# Patient Record
Sex: Male | Born: 1960 | Race: Black or African American | Hispanic: No | Smoking: Never smoker
Health system: Southern US, Community
[De-identification: ages and names within clinical notes are randomized; demographics above are authoritative.]

## PROBLEM LIST (undated history)

## (undated) DIAGNOSIS — R269 Unspecified abnormalities of gait and mobility: Secondary | ICD-10-CM

## (undated) DIAGNOSIS — E559 Vitamin D deficiency, unspecified: Secondary | ICD-10-CM

## (undated) DIAGNOSIS — N319 Neuromuscular dysfunction of bladder, unspecified: Secondary | ICD-10-CM

## (undated) DIAGNOSIS — G35 Multiple sclerosis: Secondary | ICD-10-CM

## (undated) DIAGNOSIS — F09 Unspecified mental disorder due to known physiological condition: Secondary | ICD-10-CM

## (undated) DIAGNOSIS — F079 Unspecified personality and behavioral disorder due to known physiological condition: Secondary | ICD-10-CM

## (undated) DIAGNOSIS — G8250 Quadriplegia, unspecified: Secondary | ICD-10-CM

## (undated) DIAGNOSIS — F0390 Unspecified dementia without behavioral disturbance: Secondary | ICD-10-CM

## (undated) HISTORY — DX: Multiple sclerosis: G35

## (undated) HISTORY — PX: OTHER SURGICAL HISTORY: SHX169

## (undated) HISTORY — DX: Unspecified personality and behavioral disorder due to known physiological condition: F07.9

## (undated) HISTORY — DX: Neuromuscular dysfunction of bladder, unspecified: N31.9

## (undated) HISTORY — DX: Unspecified dementia, unspecified severity, without behavioral disturbance, psychotic disturbance, mood disturbance, and anxiety: F03.90

## (undated) HISTORY — DX: Vitamin D deficiency, unspecified: E55.9

## (undated) HISTORY — DX: Unspecified mental disorder due to known physiological condition: F09

## (undated) HISTORY — DX: Quadriplegia, unspecified: G82.50

## (undated) HISTORY — DX: Unspecified abnormalities of gait and mobility: R26.9

---

## 1998-04-01 ENCOUNTER — Emergency Department (HOSPITAL_COMMUNITY): Admission: EM | Admit: 1998-04-01 | Discharge: 1998-04-01 | Payer: Self-pay | Admitting: Internal Medicine

## 1999-01-13 ENCOUNTER — Ambulatory Visit (HOSPITAL_COMMUNITY): Admission: RE | Admit: 1999-01-13 | Discharge: 1999-01-13 | Payer: Self-pay | Admitting: Neurology

## 1999-04-27 ENCOUNTER — Encounter: Payer: Self-pay | Admitting: Emergency Medicine

## 1999-04-27 ENCOUNTER — Inpatient Hospital Stay (HOSPITAL_COMMUNITY): Admission: EM | Admit: 1999-04-27 | Discharge: 1999-05-04 | Payer: Self-pay | Admitting: Emergency Medicine

## 1999-04-28 ENCOUNTER — Encounter: Payer: Self-pay | Admitting: Internal Medicine

## 1999-05-04 ENCOUNTER — Inpatient Hospital Stay (HOSPITAL_COMMUNITY)
Admission: RE | Admit: 1999-05-04 | Discharge: 1999-05-21 | Payer: Self-pay | Admitting: Physical Medicine and Rehabilitation

## 2000-09-05 ENCOUNTER — Encounter: Admission: RE | Admit: 2000-09-05 | Discharge: 2000-11-01 | Payer: Self-pay | Admitting: Neurology

## 2002-04-03 ENCOUNTER — Encounter: Payer: Self-pay | Admitting: Emergency Medicine

## 2002-04-04 ENCOUNTER — Encounter: Payer: Self-pay | Admitting: Internal Medicine

## 2002-04-04 ENCOUNTER — Inpatient Hospital Stay (HOSPITAL_COMMUNITY): Admission: EM | Admit: 2002-04-04 | Discharge: 2002-04-08 | Payer: Self-pay

## 2005-03-07 ENCOUNTER — Encounter: Admission: RE | Admit: 2005-03-07 | Discharge: 2005-04-07 | Payer: Self-pay | Admitting: Neurology

## 2005-05-27 ENCOUNTER — Inpatient Hospital Stay (HOSPITAL_COMMUNITY): Admission: AD | Admit: 2005-05-27 | Discharge: 2005-06-01 | Payer: Self-pay | Admitting: Neurology

## 2007-10-02 ENCOUNTER — Emergency Department (HOSPITAL_COMMUNITY): Admission: EM | Admit: 2007-10-02 | Discharge: 2007-10-02 | Payer: Self-pay | Admitting: Emergency Medicine

## 2008-06-19 ENCOUNTER — Encounter: Admission: RE | Admit: 2008-06-19 | Discharge: 2008-07-22 | Payer: Self-pay | Admitting: Neurology

## 2008-07-24 ENCOUNTER — Encounter: Admission: RE | Admit: 2008-07-24 | Discharge: 2008-10-22 | Payer: Self-pay | Admitting: Neurology

## 2009-03-23 ENCOUNTER — Emergency Department (HOSPITAL_COMMUNITY): Admission: EM | Admit: 2009-03-23 | Discharge: 2009-03-23 | Payer: Self-pay | Admitting: Emergency Medicine

## 2009-10-11 ENCOUNTER — Inpatient Hospital Stay (HOSPITAL_COMMUNITY): Admission: EM | Admit: 2009-10-11 | Discharge: 2009-10-16 | Payer: Self-pay | Admitting: Emergency Medicine

## 2010-06-19 LAB — CBC
Hemoglobin: 11.4 g/dL — ABNORMAL LOW (ref 13.0–17.0)
MCH: 28.9 pg (ref 26.0–34.0)
MCV: 85.1 fL (ref 78.0–100.0)
RDW: 14.5 % (ref 11.5–15.5)
WBC: 10.5 10*3/uL (ref 4.0–10.5)

## 2010-06-20 LAB — CBC
HCT: 37.6 % — ABNORMAL LOW (ref 39.0–52.0)
HCT: 40.3 % (ref 39.0–52.0)
Hemoglobin: 12.4 g/dL — ABNORMAL LOW (ref 13.0–17.0)
Hemoglobin: 12.6 g/dL — ABNORMAL LOW (ref 13.0–17.0)
MCH: 28.6 pg (ref 26.0–34.0)
MCHC: 33.5 g/dL (ref 30.0–36.0)
MCHC: 34 g/dL (ref 30.0–36.0)
MCV: 84.3 fL (ref 78.0–100.0)
MCV: 86.1 fL (ref 78.0–100.0)
Platelets: 191 10*3/uL (ref 150–400)
RBC: 4.37 MIL/uL (ref 4.22–5.81)
RDW: 14.3 % (ref 11.5–15.5)
RDW: 15.1 % (ref 11.5–15.5)
WBC: 12.9 10*3/uL — ABNORMAL HIGH (ref 4.0–10.5)

## 2010-06-20 LAB — BASIC METABOLIC PANEL
BUN: 12 mg/dL (ref 6–23)
CO2: 26 mEq/L (ref 19–32)
Calcium: 7.6 mg/dL — ABNORMAL LOW (ref 8.4–10.5)
Calcium: 7.6 mg/dL — ABNORMAL LOW (ref 8.4–10.5)
Calcium: 7.9 mg/dL — ABNORMAL LOW (ref 8.4–10.5)
Calcium: 8.2 mg/dL — ABNORMAL LOW (ref 8.4–10.5)
Chloride: 102 mEq/L (ref 96–112)
Chloride: 103 mEq/L (ref 96–112)
Chloride: 105 mEq/L (ref 96–112)
Chloride: 106 mEq/L (ref 96–112)
GFR calc Af Amer: >60 mL/min (ref >60)
GFR calc Af Amer: >60 mL/min (ref >60)
GFR calc Af Amer: >60 mL/min (ref >60)
GFR calc non Af Amer: 53 mL/min — ABNORMAL LOW (ref >60)
GFR calc non Af Amer: >60 mL/min (ref >60)
Glucose, Bld: 122 mg/dL — ABNORMAL HIGH (ref 70–99)
Glucose, Bld: 139 mg/dL — ABNORMAL HIGH (ref 70–99)
Glucose, Bld: 92 mg/dL (ref 70–99)
Potassium: 3.1 mEq/L — ABNORMAL LOW (ref 3.5–5.1)
Potassium: 3.9 mEq/L (ref 3.5–5.1)
Sodium: 136 mEq/L (ref 135–145)
Sodium: 137 mEq/L (ref 135–145)
Sodium: 139 mEq/L (ref 135–145)

## 2010-06-20 LAB — CULTURE, BLOOD (ROUTINE X 2): Culture: NO GROWTH

## 2010-06-20 LAB — POCT CARDIAC MARKERS
Myoglobin, poc: 428 ng/mL (ref 12–200)
Troponin i, poc: <0.05 ng/mL (ref 0.00–0.09)

## 2010-06-20 LAB — DIFFERENTIAL
Basophils Absolute: 0 10*3/uL (ref 0.0–0.1)
Basophils Relative: 0 % (ref 0–1)
Eosinophils Absolute: 0 10*3/uL (ref 0.0–0.7)
Eosinophils Absolute: 0 10*3/uL (ref 0.0–0.7)
Eosinophils Relative: 0 % (ref 0–5)
Eosinophils Relative: 0 % (ref 0–5)
Lymphs Abs: 0.9 10*3/uL (ref 0.7–4.0)
Monocytes Absolute: 0.3 10*3/uL (ref 0.1–1.0)
Monocytes Absolute: 0.9 10*3/uL (ref 0.1–1.0)
Monocytes Relative: 7 % (ref 3–12)
Neutro Abs: 8.8 10*3/uL — ABNORMAL HIGH (ref 1.7–7.7)
Neutrophils Relative %: 85 % — ABNORMAL HIGH (ref 43–77)
Neutrophils Relative %: 89 % — ABNORMAL HIGH (ref 43–77)

## 2010-06-20 LAB — URINALYSIS, ROUTINE W REFLEX MICROSCOPIC
Glucose, UA: NEGATIVE mg/dL
Nitrite: NEGATIVE
Specific Gravity, Urine: 1.034 — ABNORMAL HIGH (ref 1.005–1.030)

## 2010-06-20 LAB — URINE MICROSCOPIC-ADD ON

## 2010-06-20 LAB — URINE CULTURE: Colony Count: >=100000

## 2010-08-20 NOTE — H&P (Signed)
Shawn, Woodard NO.:  [id number]   MEDICAL RECORD NO.:  [id number]          PATIENT TYPE:  INP   LOCATION:  3033                         FACILITY:  [facility]   PHYSICIAN:  Marlan Palau, M.D.  DATE OF BIRTH:  1960/09/07   DATE OF ADMISSION:  05/27/2005  DATE OF DISCHARGE:                                HISTORY & PHYSICAL   HISTORY OF PRESENT ILLNESS:  Shawn Woodard is a 50 year old black male, born  07/28/1960, with a history of multiple sclerosis.  This patient has had a  gradual progressive downhill course of MS over the last 15 to 18 years,  followed through [facility].  The patient has been  treated with Avonex but has continued to decline in his physical abilities.  This patient has been residing in an assisted living situation but it has  gotten to the point that he now requires skilled nursing care.  The patient  is transitioning to the hospital for three days prior to admission to a  skilled level nursing facility.  The has gradually had worsening cognitive  abilities and right hemiparesis and is no longer able to ambulate.   PAST MEDICAL HISTORY:  1.  History of multiple sclerosis.  2.  Gait disorder.  3.  Organic brain syndrome.  4.  Neurogenic bladder.   MEDICATIONS:  1.  Avonex once a week injection.  2.  Baclofen 10 mg q.i.d.   The patient has no known allergies.   He does not smoke or drink.   SOCIAL HISTORY:  This patient is single and has no children.  He has had to  retire as a Press photographer, has a Naval architect.   FAMILY MEDICAL HISTORY:  Notable that the parents are alive.  He has a  brother, two sisters.   REVIEW OF SYSTEMS:  Notable for no recent fevers, chills.  The patient  denies headache, visual field changes.  Denies any shortness of breath,  chest pain, abdominal pain, nausea, vomiting.  Denies any problems  controlling the bowels or bladder.  Denies any new numbness.  He has had  progressive weakness on the right side.  The patient denies any dizziness.   PHYSICAL EXAMINATION:  VITAL SIGNS:  Blood pressure is 120/80, heart rate  64.  GENERAL:  This patient is a thin black male who is alert, cooperative at the  time of examination.  HEENT:  Head is atraumatic.  Eyes:  Pupils are round, reactive to light.  Disks soft, flat bilaterally.  NECK:  Supple.  No carotid bruits noted.  RESPIRATORY:  Clear.  CARDIOVASCULAR:  Reveals a regular rate and rhythm.  No obvious murmurs rubs  noted.  ABDOMEN:  Reveals positive bowel sounds.  No organomegaly or tenderness  noted.  EXTREMITIES:  Without significant edema.  NEUROLOGIC:  Cranial nerves as above.  Facial symmetry is present.  The  patient has full extraocular movements with the exception of a prominent  left internuclear ophthalmoplegia, some end gaze nystagmus is seen.  The  patient has full visual fields to double simultaneous stimulation.  Speech  has a cerebellar quality.  Dysarthria is noted.  Good pinprick, soft touch  sensation to the face is noted.  Good strength of the facial muscles,  muscles for head turning, shoulder shrug bilaterally.  No aphasia noted.  Motor testing reveals obvious right sided weakness, decreased grip on the  right side, increased motor tone on the right arm, right leg as compared to  the left.  The patient has good strength on the left arm and leg, 4/5  strength on the right.  The patient has good symmetry to pinprick, soft  touch, vibratory sensation throughout, has ataxia with finger-to-nose-  finger, toe-to-finger on the left.  Slowness and some ataxia noted on the  right.  Decreased rapid alternating movements to the right arm as compared  to the left.  The patient has drift on the right arm.  Deep tendon reflexes  are if anything slightly decreased on the right arm as compared to the left.  The patient has more symmetry at the reflexes in the legs.  He has a very  definite  upgoing toe on the right.  Some ankle clonus noted on the left.  The patient has good pinprick, soft touch, vibratory sensation throughout.  The patient was not ambulated.   LABORATORY VALUES:  Notable for a white count of 5.5, hemoglobin 15.0,  hematocrit 45.5, MCV of 86.1, platelets of 283.  Sodium 139, potassium 4.3,  chloride of 104, CO2 of 31, glucose of 117, BUN of 10, creatinine 0.9.  Total bili 1.2, alk phos is 74, SGOT of 31, SGPT of 18, total protein 6.1,  albumin of 3.5, calcium 8.4.  Chest x-ray is pending.  EKG is pending.  Urinalysis also pending.   IMPRESSION:  1.  History of multiple sclerosis with progressive course.  2.  Organic brain syndrome secondary to number one.  3.  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   The patient has had significant worsening in physical and cognitive  disability.  He is no longer able to ambulate.  The patient will require  skilled nursing facility at this time.  The patient is brought in for  evaluation prior to transfer to a skilled facility.   PLAN:  1.  Admission blood work.  2.  Avonex treatments.  3.  Case manager for help with transfer to a skilled nursing facility.  4.  The patient be seen as well by physical and occupational therapy.  5.  We will follow the patient's clinical condition while in-house.      Marlan Palau, M.D.  Electronically Signed     CKW/MEDQ  D:  05/27/2005  T:  05/27/2005  Job:  [id number]   cc:   [facility]  [address]  [address]

## 2010-08-20 NOTE — Discharge Summary (Signed)
NAMEQUANTE, PETTRY NO.:  [id number]   MEDICAL RECORD NO.:  [id number]          PATIENT TYPE:  INP   LOCATION:  3033                         FACILITY:  [facility]   PHYSICIAN:  Marlan Palau, M.D.  DATE OF BIRTH:  December 19, 1960   DATE OF ADMISSION:  05/27/2005  DATE OF DISCHARGE:                                 DISCHARGE SUMMARY   ADDENDUM  This is an addendum to a priority discharge summary done previously on  May 30, 2005.   HISTORY OF PRESENT ILLNESS:  Shawn Woodard is a 50 year old black male born  October 13, 1960, with a history of multiple sclerosis. The patient has  significant physical and cognitive deficits due to his multiple sclerosis.  The patient was admitted to [facility] on May 27, 2005, for 3-  day evaluation prior to transfer to an extended care facility. The patient  since last discharge summary dictation has been medically and  neurologically stable. The patient continues to have dementia, dysarthria,  cerebellar speech pattern, right hemiparesis, severe ataxia on all fours. At  this point, plans are for this patient to be discharged to an extended care  facility.   DISCHARGE MEDICATIONS:  1.  Avonex IM injections once a week on Mondays.  2.  Baclofen 10 mg p.o. q.i.d.  3.  Theragran M one tablet daily.  4.  Tylenol 650 mg p.o. q.4h. p.r.n.  5.  Laxative of choice p.r.n.  6.  Restoril 30 mg p.o. q.h.s. p.r.n. sleep.   The patient will have physical and occupational therapy at the extended care  facility. Will follow up with [facility] in 3 or 4  months following discharge. The patient may walk only with assistance. At  the time of discharge, the patient is bright, alert, cooperative, demented,  dysarthric.      Marlan Palau, M.D.  Electronically Signed     CKW/MEDQ  D:  06/01/2005  T:  06/01/2005  Job:  [id number]

## 2010-12-30 LAB — POCT I-STAT, CHEM 8
BUN: 15
Calcium, Ion: 1.01 — ABNORMAL LOW
Creatinine, Ser: 1.2
Glucose, Bld: 103 — ABNORMAL HIGH
HCT: 47
Potassium: 4.5
TCO2: 26

## 2011-02-14 IMAGING — CT CT ABD-PELV W/O CM
2 of 4 series · 17 of 46 positions shown, 19 images · non-contrast
Comparison: None.

CLINICAL DATA: Fever.  Hematuria.

CT ABDOMEN AND PELVIS WITHOUT CONTRAST
TECHNIQUE: Multidetector CT imaging of the abdomen and pelvis was
performed following the standard protocol without intravenous
contrast.

[Series 2: under 200# stone no prev · axial · 0.74mm/px · z∈[-220,+186]mm · 14 of 89 slices shown, 16 images]
[im 4/89  soft-tissue]
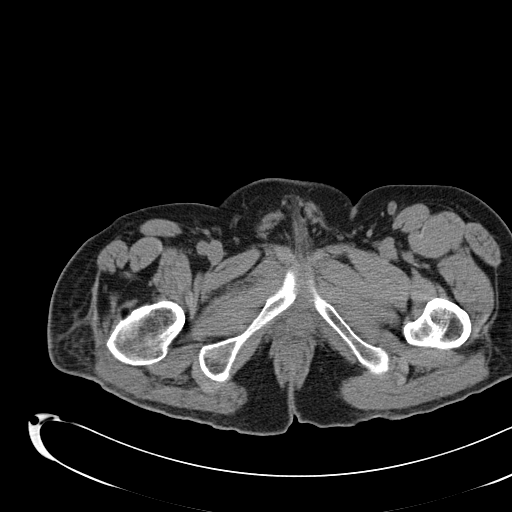
[im 4/89  bone]
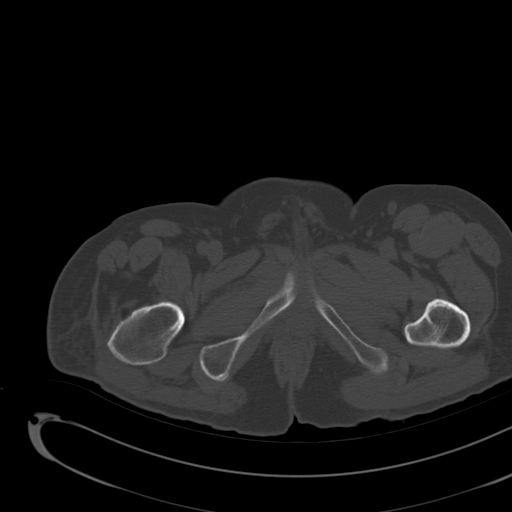
[im 10/89  soft-tissue]
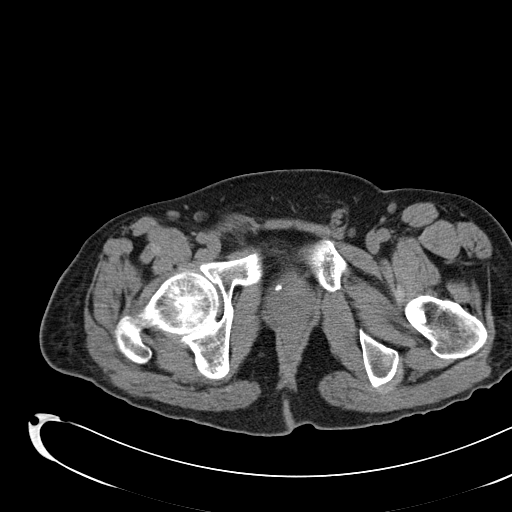
[im 17/89  soft-tissue]
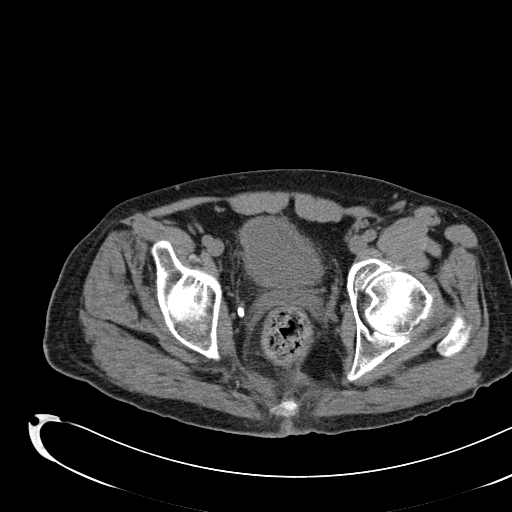
[im 23/89  soft-tissue]
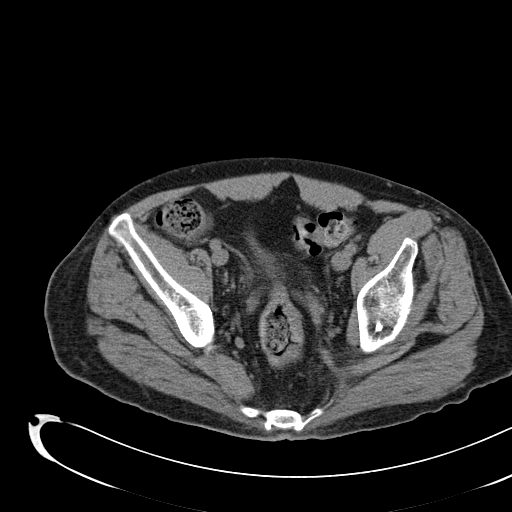
[im 30/89  soft-tissue]
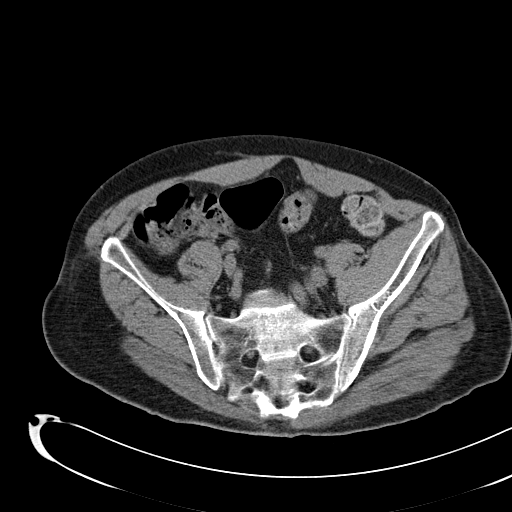
[im 36/89  soft-tissue]
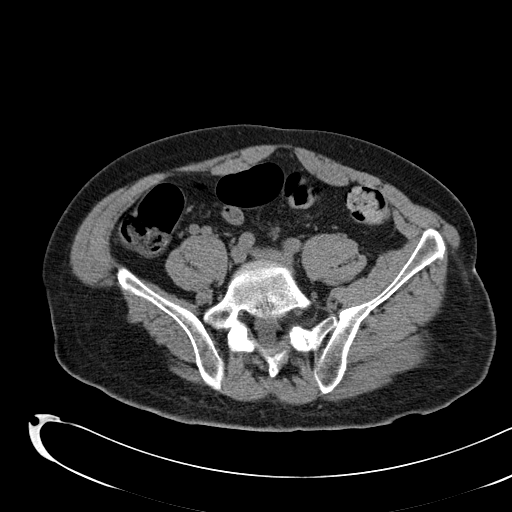
[im 43/89  soft-tissue]
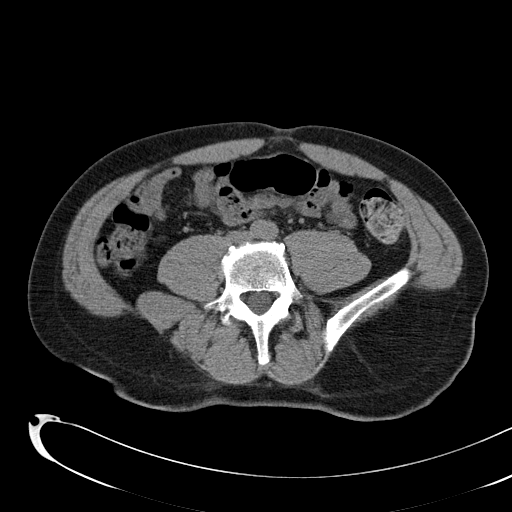
[im 46/89  soft-tissue]
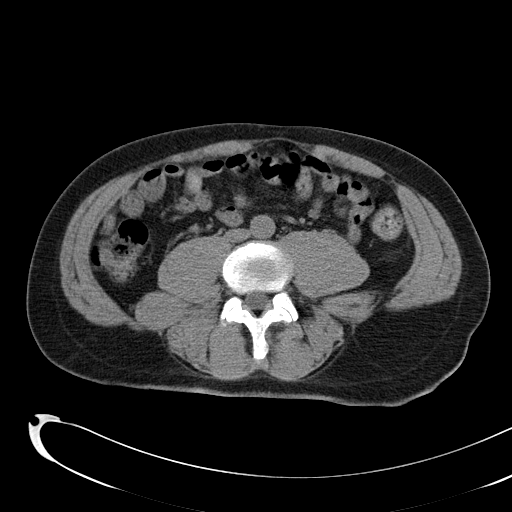
[im 53/89  soft-tissue]
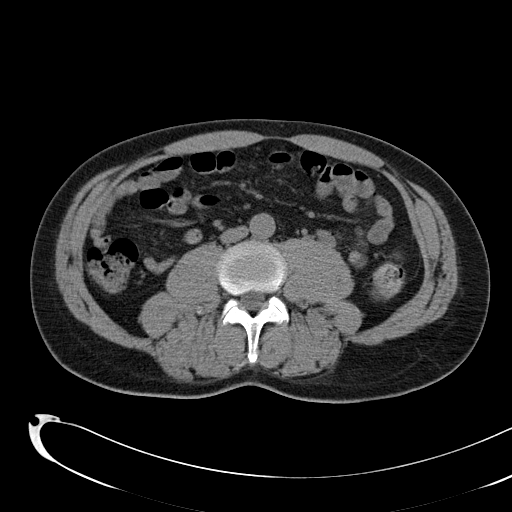
[im 53/89  bone]
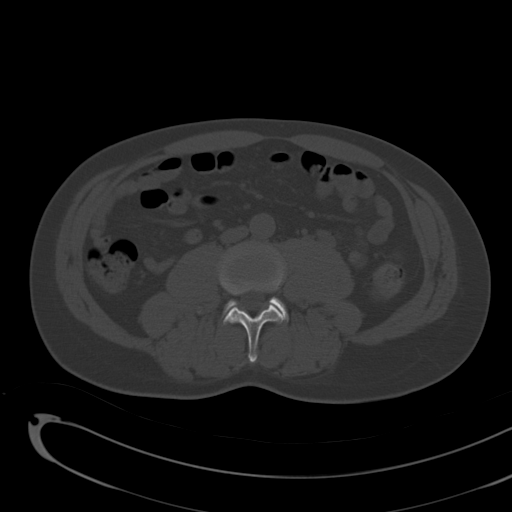
[im 59/89  soft-tissue]
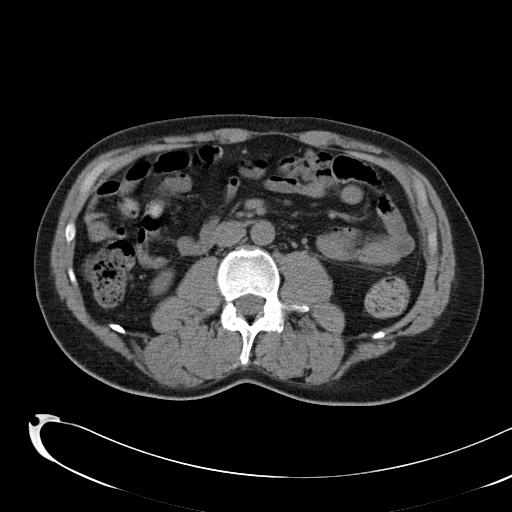
[im 66/89  soft-tissue]
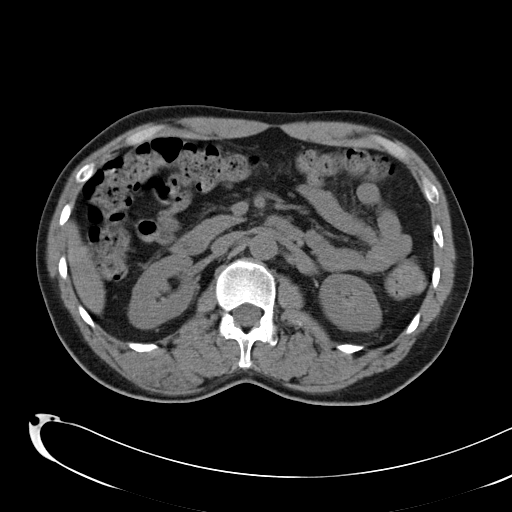
[im 72/89  soft-tissue]
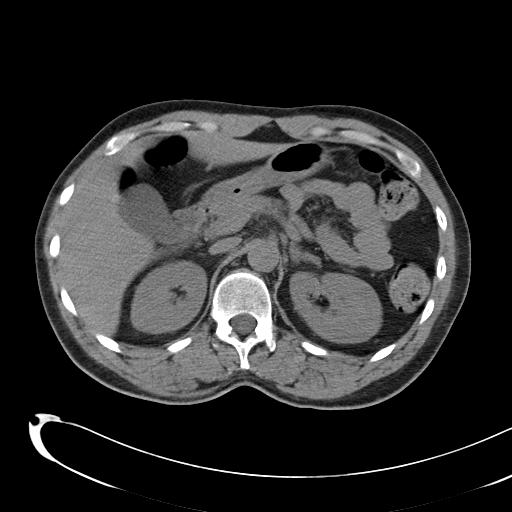
[im 79/89  soft-tissue]
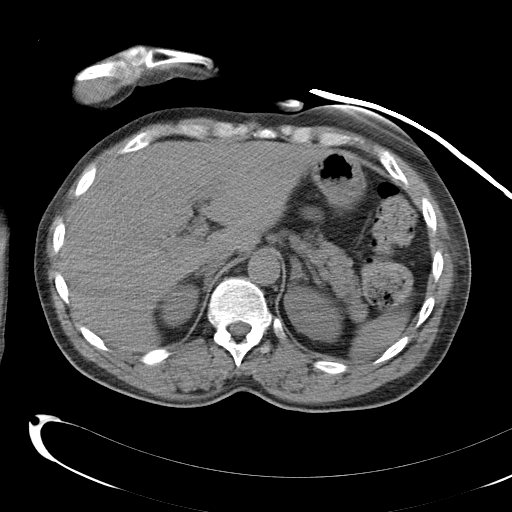
[im 85/89  soft-tissue]
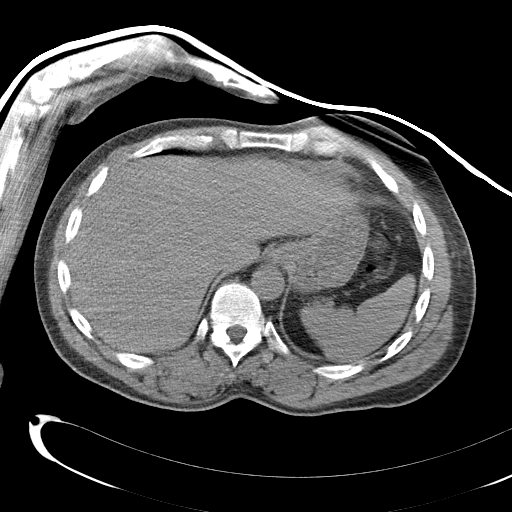

[Series 602: coronal · coronal · 0.90mm/px · 3 of 69 slices shown]
[im 23/69  soft-tissue]
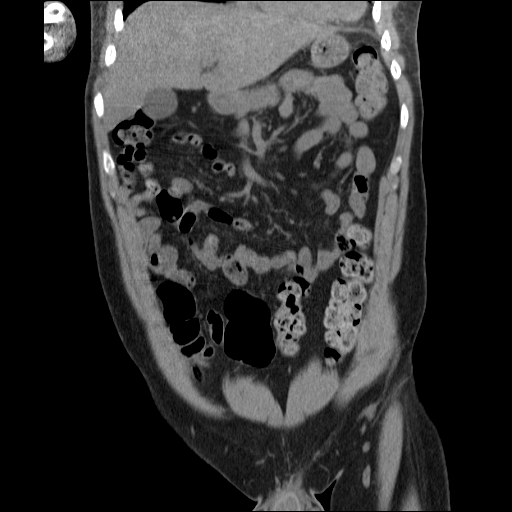
[im 31/69  soft-tissue]
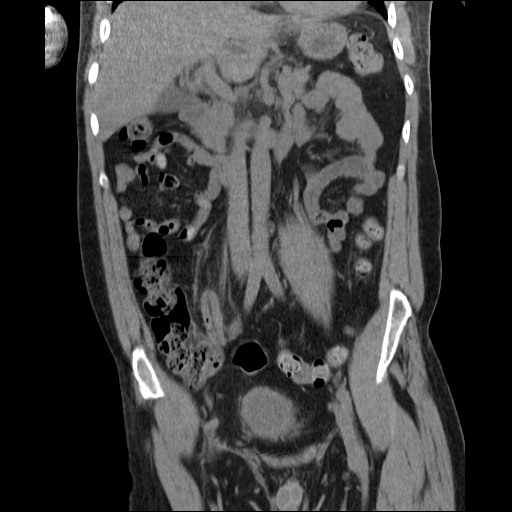
[im 38/69  soft-tissue]
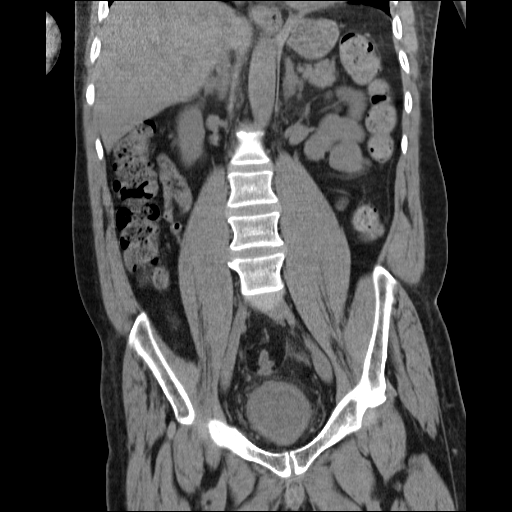

[17 of 46 positions shown; findings below may reference images not displayed]

FINDINGS: Dependent atelectasis at the lung bases.  Liver,
gallbladder, spleen within normal limits.  Thickening of both
adrenal glands, likely adenomatous infiltration or adrenal
hyperplasia.  Patulous gastroesophageal junction.  No renal calculi
are identified.  No significant perinephric stranding or
hydronephrosis.  Small and large bowel grossly normal. Unenhanced
CT was performed per clinician order.  Lack of IV contrast limits
sensitivity and specificity, especially for evaluation of
abdominal/pelvic solid viscera. No free fluid in the anatomic
pelvis.

Urinary bladder demonstrates mural thickening allowing for non
distention.  There is peri cystic stranding consistent with
cystitis.  Prostatic megaly.  The colon appears within normal
limits. Normal appendix.  No aggressive appearing osteolytic or
osteoblastic lesions.  Bone island in the left femoral neck.
Likely injection granuloma over both gluteal regions.
IMPRESSION: 1.  Inflammatory changes of the urinary bladder compatible with
cystitis.
2.  Prostatomegaly.
3.  Bilateral adrenal thickening likely representing hyperplasia.

## 2013-04-12 ENCOUNTER — Encounter: Payer: Self-pay | Admitting: Neurology

## 2013-04-12 ENCOUNTER — Ambulatory Visit (INDEPENDENT_AMBULATORY_CARE_PROVIDER_SITE_OTHER): Payer: PRIVATE HEALTH INSURANCE | Admitting: Neurology

## 2013-04-12 ENCOUNTER — Encounter (INDEPENDENT_AMBULATORY_CARE_PROVIDER_SITE_OTHER): Payer: Self-pay

## 2013-04-12 VITALS — BP 108/77 | HR 76

## 2013-04-12 DIAGNOSIS — G35 Multiple sclerosis: Secondary | ICD-10-CM

## 2013-04-12 HISTORY — DX: Multiple sclerosis: G35

## 2013-04-12 NOTE — Progress Notes (Signed)
Reason for visit: Multiple sclerosis  Shawn Woodard is an 53 y.o. male  History of present illness:  Mr. Shawn Woodard is a 53 year old right-handed black male with a history of chronic progressive multiple sclerosis. The patient has continued to progress over time, and he currently is residing in an extended care facility. The patient at times requires assistance with feeding. The patient is on thick liquids, but a regular diet otherwise. The patient has not had any recent falls. The patient is nonambulatory. The patient requires an adult diaper. The patient denies any pain, but he does have flexion contractures of the knees and of the right elbow. The patient has better function of the left arm. The speech has become quite dysarthric, almost unintelligible. No significant skin issues are present.  Past Medical History  Diagnosis Date  . MS (multiple sclerosis)   . Gait disorder   . Dementia   . Neurogenic bladder   . Vitamin D deficiency   . 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 Multiple sclerosis 04/12/2013  . Spastic quadriparesis   . Dementia     Past Surgical History  Procedure Laterality Date  . None      Family History  Problem Relation Age of Onset  . Stroke Father     Social history:  reports that he has never smoked. He does not have any smokeless tobacco history on file. He reports that he does not drink alcohol or use illicit drugs.   No Known Allergies  Medications:  No current outpatient prescriptions on file prior to visit.   No current facility-administered medications on file prior to visit.    ROS:  Out of a complete 14 system review of symptoms, the patient complains only of the following symptoms, and all other reviewed systems are negative.  Cough, choking Excessive thirst Memory loss, speech problems Restless legs, daytime sleepiness  Blood pressure 108/77, pulse 76, weight 0 lb (0 kg).  Physical  Exam  General: The patient is alert and cooperative at the time of the examination.  Skin: No significant peripheral edema is noted.   Neurologic Exam  Mental status: The patient is alert, able to cooperate with the examination.  Cranial nerves: Facial symmetry is present. Speech is very dysarthric, almost unintelligible. The patient has restriction of movements in all fields, prominent nystagmus is seen. Visual fields appear to be full.  Motor: The patient has increased tone on both lower extremities, with slight flexion contractures of both knees. The right arm is in flexion, unable to straighten. The patient has some use of the left arm, with 4/5 strength  Sensory examination: Soft touch sensation on the face, arms, and legs is symmetric.  Coordination: The patient is unable to perform finger-nose-finger on the right, ataxia is noted with finger-nose-finger on the left. The patient cannot perform heel-to-shin on either side.  Gait and station: The patient cannot be ambulated, the patient is wheelchair-bound.  Reflexes: Deep tendon reflexes are symmetric, slightly brisk .   Assessment/Plan:  1. Multiple sclerosis  2. Gait disorder  3. Dementia  4. Spastic quadriparesis  The patient is doing quite poorly. The patient has significant mobility issues, dementia, and dysarthria. The patient is essentially bedridden. The patient will require range of movement exercises daily. The patient will followup in one year. The patient is chronic progressive multiple sclerosis, not amenable to medical therapy.  Marlan Palau MD 04/13/2013 10:20 AM  [facility] [address]  [place], [place] [address]  Phone [phone] Fax [phone]

## 2013-04-12 NOTE — Patient Instructions (Signed)
Multiple Sclerosis Multiple sclerosis (MS) is a disease of the central nervous system. Its cause is unknown. It is more common in the northern states than in the southern states. There is a higher incidence of MS in women. There is a wide variation in the symptoms (problems) of MS. This is because of the many different ways it affects the central nervous system. It often comes on in episodes or attacks. These attacks may last weeks to months. There may be long periods of nearly no problems between attacks. The main symptoms include visual problems (associated with eye pain), numbness, weakness, and paralysis in extremities (arms/hands and legs/feet). There may also be tremors and problems with balance and walking. The age when MS starts is variable. Advances in medicine continue to improve the treatment of this illness. There is no known cure for MS but there are medications that help. MS is not an inherited illness, although your risk of getting this disease is higher if you have a relative with MS. The best radiologic (x-ray) study for MS is an MRI (magnetic resonance imaging). There are medications available to decrease the number and frequency of attacks. SYMPTOMS  The symptoms of MS are caused by loss of insulation (myelin) of the nerves of the brain. When this happens, brain signals do not get transmitted properly or may not get transmitted at all. Some of the problems caused by this include:   Numbness.  Weakness.  Paralysis in extremities.  Visual problems, eye pain.  Balance problems.  Tremors. DIAGNOSIS  Your caregiver can do studies on you to make this diagnosis. This may include specialized X-rays and spinal fluid studies. HOME CARE INSTRUCTIONS   Take medications as directed by your caregiver. Baclofen is a drug commonly used to reduce muscle spasticity. Steroids are often used for short term relief.  Exercise as directed.  Use physical and occupational therapy as directed by  your caregiver. Careful attention to this medical care can help avoid depression.  See your caregiver if you begin to have problems with depression. This is a common problem in MS. Patients often continue to work many years after the diagnosis of MS. Document Released: 03/18/2000 Document Revised: 06/13/2011 Document Reviewed: 10/25/2006 Exit[employer] Patient Information 2014 [employer].  

## 2013-07-23 ENCOUNTER — Telehealth: Payer: Self-pay | Admitting: Nurse Practitioner

## 2013-07-23 MED ORDER — BACLOFEN 10 MG PO TABS: ORAL_TABLET | ORAL | Status: DC

## 2013-07-23 NOTE — Telephone Encounter (Signed)
I called and talked with the daughter. The patient has had severe spasticity involving the legs, the patient is having difficulty being managed because of this. The patient is not on any medications for spasticity, I'll get back up and started. The patient may need a followup visit for this issue.  The patient is residing in [facility].

## 2013-07-23 NOTE — Telephone Encounter (Signed)
Latoya, legal guardian, calling to state that patient's  legs are contracting more - nursing home staff are not doing anything for him. Latoya feels something more needs to be done for him. Please call to advise [phone].

## 2013-07-26 ENCOUNTER — Telehealth: Payer: Self-pay

## 2013-07-26 ENCOUNTER — Telehealth: Payer: Self-pay | Admitting: *Deleted

## 2013-07-26 ENCOUNTER — Encounter: Payer: Self-pay | Admitting: *Deleted

## 2013-07-26 NOTE — Telephone Encounter (Signed)
Called patient to schedule sooner follow up with NP. Patient's phone does not accept incoming calls. Will mail a letter, appointment 08/01/13 at 2:45 with Lam.

## 2013-07-26 NOTE — Telephone Encounter (Signed)
I spoke with Elnita Maxwell at [facility] [phone]).  She said all correspondences may be faxed to [phone].

## 2013-08-01 ENCOUNTER — Ambulatory Visit: Payer: Self-pay | Admitting: Nurse Practitioner

## 2013-08-05 ENCOUNTER — Non-Acute Institutional Stay (SKILLED_NURSING_FACILITY): Payer: PRIVATE HEALTH INSURANCE | Admitting: Internal Medicine

## 2013-08-05 DIAGNOSIS — R1319 Other dysphagia: Secondary | ICD-10-CM

## 2013-08-05 DIAGNOSIS — I4891 Unspecified atrial fibrillation: Secondary | ICD-10-CM

## 2013-08-05 DIAGNOSIS — E559 Vitamin D deficiency, unspecified: Secondary | ICD-10-CM

## 2013-08-05 DIAGNOSIS — I69959 Hemiplegia and hemiparesis following unspecified cerebrovascular disease affecting unspecified side: Secondary | ICD-10-CM

## 2013-08-05 DIAGNOSIS — G4700 Insomnia, unspecified: Secondary | ICD-10-CM

## 2013-08-05 DIAGNOSIS — F411 Generalized anxiety disorder: Secondary | ICD-10-CM | POA: Insufficient documentation

## 2013-08-05 DIAGNOSIS — I69359 Hemiplegia and hemiparesis following cerebral infarction affecting unspecified side: Secondary | ICD-10-CM | POA: Insufficient documentation

## 2013-08-05 DIAGNOSIS — G35 Multiple sclerosis: Secondary | ICD-10-CM

## 2013-08-05 DIAGNOSIS — K5900 Constipation, unspecified: Secondary | ICD-10-CM

## 2013-08-05 NOTE — Progress Notes (Signed)
Patient ID: Shawn Woodard, male   DOB: 05-03-1960, 53 y.o.   MRN: [id number]     Shawn Woodard  PCP: Shawn Woodard, Shawn Woodard  Code Status: full code  No Known Allergies  Chief Complaint: new admission  HPI:  53 y/o male patient is here for long term care. He was residing in another SNF and has been transferred here. He has history of multiple sclerosis, dysphagia and dementia. He is seen in his room today. He is alert and in his bed. His communication is limited with his aphasia. He has no concerns this visit. No new concern from nursing staff. He needs assistance with eating and is dependent with bathing, dressing and toileting. He can self propel on wheelcahir  Review of Systems:  Constitutional: Negative for fever, chills, diaphoresis. weight is trending down according to prior facility notes. He is on regular diet with nectar thick liquids. Also on magic cup HENT: Negative for congestion, hearing loss and sore throat.   Eyes: Negative for eye pain, discharge.  Respiratory: Negative for cough, sputum production, shortness of breath and wheezing.   Cardiovascular: Negative for chest pain, palpitations, orthopnea and leg swelling.  Gastrointestinal: Negative for heartburn, nausea, vomiting, abdominal pain, diarrhea and constipation.  Genitourinary: Negative for dysuria, urgency, frequency, hematuria and flank pain.  Musculoskeletal: Negative for myalgias.  Skin: Negative for itching and rash.  Neurological: Negative for dizziness, tingling, headaches.  Psychiatric/Behavioral: has memory loss    Past Medical History  Diagnosis Date  . MS (multiple sclerosis)   . Gait disorder   . Dementia   . Neurogenic bladder   . Vitamin D deficiency   . 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 Multiple sclerosis 04/12/2013  . Spastic quadriparesis   . Dementia    Past Surgical History  Procedure Laterality Date  . None     Social History:   reports  that he has never smoked. He does not have any smokeless tobacco history on file. He reports that he does not drink alcohol or use illicit drugs.  Family History  Problem Relation Age of Onset  . Stroke Father     Medications: Patient's Medications  New Prescriptions   No medications on file  Previous Medications   ACETAMINOPHEN (TYLENOL) 325 MG TABLET    Take 650 mg by mouth every 6 (six) hours as needed.   BACLOFEN (LIORESAL) 10 MG TABLET    One half tablet 3 times daily for one month, then take one full tablet 3 times daily   GLYCOPYRROLATE (CUVPOSA) 1 MG/5ML SOLN    Take 1 mg by mouth daily. 28mL once daily   MELATONIN 1 MG TABS    Take 1 mg by mouth at bedtime.    MULTIPLE VITAMIN (MULTIVITAMIN) TABLET    Take 1 tablet by mouth daily.   POLYETHYLENE GLYCOL 3350 POWD    3,350 g by Does not apply route.   SENNOSIDES-DOCUSATE SODIUM (SENOKOT-S) 8.6-50 MG TABLET    Take 1 tablet by mouth daily.   VITAMIN D, ERGOCALCIFEROL, (DRISDOL) 50000 UNITS CAPS CAPSULE    Take 50,000 Units by mouth every 30 (thirty) days.   Modified Medications   No medications on file  Discontinued Medications   No medications on file     Physical Exam: BP 130/68  Pulse 75  Temp(Src) 97 F (36.1 C)  Resp 18  Wt 154 lb (69.854 kg)  SpO2 96%  General- adult male in no acute distress.  Aphasia but is able to comprehend Head- atraumatic, normocephalic Eyes- PERRLA, EOMI, no pallor, no icterus, no discharge Neck- no lymphadenopathy, no thyromegaly, no jugular vein distension Throat- moist mucus membrane, normal oropharynx Nose- normal nasal mucosa, no maxillary or frontal sinus tenderness Cardiovascular- normal s1,s2, no murmurs/ rubs/ gallops Respiratory- bilateral clear to auscultation, no wheeze, no rhonchi, no crackles, no use of accessory muscles Abdomen- bowel sounds present, soft, non tender, no CVA tenderness Musculoskeletal- right hemiparesis, able to move left side, right hand flaccid, no spinal  and paraspinal tenderness, on wheelchair, no leg edema Neurological- no focal deficit Skin- warm and dry Psychiatry- alert and normal mood   Labs reviewed: None available for review  Assessment/Plan  cva with right sided hemiplegia bp stable. Off all medication. Not on any statin or any antiplatelet agent at present. Check lipid panel  Multiple sclerosis Off all medications at present. On baclofen 5 mg tid for a month and then on 10 mg tid after end of may as per neurology. Monitor clinically. Continue this. Encourage OOB and mobility  Dysphagia Aspiration precautions, regular diet with nectar thick liquid  afib Rate controlled. Currently off all medications. Not on any anticoagulation  Anxiety Stable off all meds  insomina Stable on melatonin 1 mg daily, monitor clinically  Constipation Continue senna s bid and miralax daily and monitor for now  Vitamin d def Continue vitamin d 50,000 u once a week and monitor. Check vit d level  Vascular dementia bp stable. Not on any statin or aspirin at present. Monitor cognition. Fall precautions. Skin care. Check cmp and lipid panel with a1c  Family/ staff Communication: reviewed care plan with patient and nursing supervisor   Goals of care: long term are   Labs/tests ordered: cbc, cmp, a1c, vit d, lipid panel    Shawn GroutMAHIMA Laneka Mcgrory, Shawn Woodard  [facility] [phone] (Monday-Friday 8 am - 5 pm) [phone] (afterhours)

## 2013-08-06 LAB — LIPID PANEL
CHOLESTEROL: 175 mg/dL (ref 0–200)
LDL Cholesterol: 121 mg/dL
TRIGLYCERIDES: 91 mg/dL (ref 40–160)

## 2013-08-06 LAB — HEPATIC FUNCTION PANEL
ALT: 13 U/L (ref 10–40)
AST: 14 U/L (ref 14–40)
Alkaline Phosphatase: 97 U/L (ref 25–125)
BILIRUBIN, TOTAL: 0.4 mg/dL

## 2013-08-06 LAB — BASIC METABOLIC PANEL
BUN: 11 mg/dL (ref 4–21)
CREATININE: 0.8 mg/dL (ref 0.6–1.3)
Glucose: 82 mg/dL
POTASSIUM: 4.4 mmol/L (ref 3.4–5.3)
Sodium: 136 mmol/L — AB (ref 137–147)

## 2013-08-06 LAB — CBC AND DIFFERENTIAL
HCT: 46 % (ref 41–53)
HEMOGLOBIN: 14.9 g/dL (ref 13.5–17.5)
PLATELETS: 298 10*3/uL (ref 150–399)
WBC: 5.2 10^3/mL

## 2013-08-06 LAB — HEMOGLOBIN A1C: HEMOGLOBIN A1C: 5.4 % (ref 4.0–6.0)

## 2013-08-29 ENCOUNTER — Encounter: Payer: Self-pay | Admitting: *Deleted

## 2013-08-29 ENCOUNTER — Encounter (INDEPENDENT_AMBULATORY_CARE_PROVIDER_SITE_OTHER): Payer: Self-pay

## 2013-08-29 ENCOUNTER — Encounter: Payer: Self-pay | Admitting: Nurse Practitioner

## 2013-08-29 ENCOUNTER — Ambulatory Visit (INDEPENDENT_AMBULATORY_CARE_PROVIDER_SITE_OTHER): Payer: PRIVATE HEALTH INSURANCE | Admitting: Nurse Practitioner

## 2013-08-29 VITALS — BP 111/74 | HR 81

## 2013-08-29 DIAGNOSIS — G35 Multiple sclerosis: Secondary | ICD-10-CM

## 2013-08-29 DIAGNOSIS — G8110 Spastic hemiplegia affecting unspecified side: Secondary | ICD-10-CM

## 2013-08-29 NOTE — Progress Notes (Signed)
I have read the note, and I agree with the clinical assessment and plan.  Charles K Willis   

## 2013-08-29 NOTE — Assessment & Plan Note (Signed)
Baclofen for Spasticity. The patient has significant mobility issues, dementia, and dysarthria. The patient is essentially bedridden. The patient will require range of movement exercises daily.

## 2013-08-29 NOTE — Progress Notes (Signed)
PATIENT: Shawn RuddVance A Woodard DOB: 1960-05-21  REASON FOR VISIT: MS follow up, request for sooner revisit. HISTORY FROM: patient  HISTORY OF PRESENT ILLNESS: Shawn Woodard is a 53 year old right-handed black male with a history of chronic progressive multiple sclerosis. The patient has continued to progress over time, and he currently is residing in an extended care facility. The patient at times requires assistance with feeding. The patient is on thick liquids, but a regular diet otherwise. The patient has not had any recent falls. The patient is nonambulatory. The patient requires an adult diaper. The patient denies any pain, but he does have flexion contractures of the knees and of the right elbow. The patient has better function of the left arm. The speech has become quite dysarthric, almost unintelligible. No significant skin issues are present.  UPDATE 08/29/13 (LL): The patient is residing in [facility]. Latoya, legal guardian, called to state recently that patient's legs are contracting more - nursing home staff are not doing anything for him. Latoya felt something more needs to be done for him. [facility].  He was started back on Baclofen, one half tablet 3 times daily for one month, then one full tablet 3 times daily.  He was just started this morning on the 10 mg dose.  Daughter feels better about care and therapy at [facility].  She can see some improvement in his spasticity.  ROS:  Out of a complete 14 system review of symptoms, the patient complains only of the following symptoms, and all other reviewed systems are negative.  speech problems  daytime sleepiness   ALLERGIES: No Known Allergies  HOME MEDICATIONS: Outpatient Prescriptions Prior to Visit  Medication Sig Dispense Refill  . Melatonin 1 MG TABS Take 1 mg by mouth at bedtime.       Marland Kitchen. acetaminophen (TYLENOL) 325 MG tablet Take 650 mg by mouth every 6 (six) hours as needed.       . baclofen (LIORESAL) 10 MG tablet One half tablet 3 times daily for one month, then take one full tablet 3 times daily  90 each  3  . Glycopyrrolate (CUVPOSA) 1 MG/5ML SOLN Take 1 mg by mouth daily. 5mL once daily      . Multiple Vitamin (MULTIVITAMIN) tablet Take 1 tablet by mouth daily.      . Polyethylene Glycol 3350 POWD 3,350 g by Does not apply route.      . sennosides-docusate sodium (SENOKOT-S) 8.6-50 MG tablet Take 1 tablet by mouth daily.      . Vitamin D, Ergocalciferol, (DRISDOL) 50000 UNITS CAPS capsule Take 50,000 Units by mouth every 30 (thirty) days.        No facility-administered medications prior to visit.   PHYSICAL EXAM  Filed Vitals:   08/29/13 1100  BP: 111/74  Pulse: 81    Physical Exam  General: The patient is alert and cooperative at the time of the examination.  Neck: Flexion contracture of neck Skin: No significant peripheral edema is noted.   Neurologic Exam  Mental status: The patient is alert, able to cooperate with the examination.  Cranial nerves: Facial symmetry is present. Speech is very dysarthric, almost unintelligible. The patient has restriction of movements in all fields, prominent nystagmus is seen. Visual fields appear to be full.  Motor: The patient has increased tone on both lower extremities, with slight flexion contractures of both knees. The right arm is in flexion, unable to straighten. The  patient has some use of the left arm, with 4/5 strength  Sensory examination: Soft touch sensation on the face, arms, and legs is symmetric.  Coordination: The patient is unable to perform finger-nose-finger on the right, ataxia is noted with finger-nose-finger on the left. The patient cannot perform heel-to-shin on either side.  Gait and station: The patient cannot be ambulated, the patient is wheelchair-bound.  Reflexes: Deep tendon reflexes are symmetric, slightly brisk, right LE clonus  ASSESSMENT AND PLAN 53 y.o. 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 here with:  1. Multiple sclerosis  2. Gait disorder  3. Dementia  4. Spastic quadriparesis  The patient is doing quite poorly. The patient has significant mobility issues, dementia, and dysarthria. The patient is essentially bedridden. The patient will require range of movement exercises daily.  The patient is chronic progressive multiple sclerosis, not amenable to medical therapy.  Baclofen was started one month ago and was increased today to 1 mg TID.  There has been mild improvement in spasticity.  This dose may be increased in the future as clinically indicated.  Discussed Baclofen pump and Botox injections for spasticity with Dr. Hosie Poisson, but daughter does not think father would want either.  Daughter was instructed to call with any questions or concerns and keep already scheduled follow up appointment. She was in agreement.   Ronal Fear, MSN, NP-C 08/29/2013, 11:00 AM [facility] [address] [place], [place] [address] [phone]  Note: This document was prepared with digital dictation and possible smart phrase technology. Any transcriptional errors that result from this process are unintentional.

## 2013-08-29 NOTE — Patient Instructions (Signed)
Continue Baclofen as ordered, it may be increased over time if clinically indicated.  Please call with any questions or concerns.  Follow up at next scheduled visit, sooner as needed.

## 2013-09-16 ENCOUNTER — Non-Acute Institutional Stay (SKILLED_NURSING_FACILITY): Payer: PRIVATE HEALTH INSURANCE | Admitting: Internal Medicine

## 2013-09-16 DIAGNOSIS — I69959 Hemiplegia and hemiparesis following unspecified cerebrovascular disease affecting unspecified side: Secondary | ICD-10-CM

## 2013-09-16 DIAGNOSIS — E43 Unspecified severe protein-calorie malnutrition: Secondary | ICD-10-CM

## 2013-09-16 DIAGNOSIS — E559 Vitamin D deficiency, unspecified: Secondary | ICD-10-CM

## 2013-10-14 ENCOUNTER — Non-Acute Institutional Stay (SKILLED_NURSING_FACILITY): Payer: PRIVATE HEALTH INSURANCE | Admitting: Internal Medicine

## 2013-10-14 DIAGNOSIS — G4700 Insomnia, unspecified: Secondary | ICD-10-CM

## 2013-10-14 DIAGNOSIS — G35 Multiple sclerosis: Secondary | ICD-10-CM

## 2013-10-14 DIAGNOSIS — F0150 Vascular dementia without behavioral disturbance: Secondary | ICD-10-CM

## 2013-11-21 ENCOUNTER — Non-Acute Institutional Stay (SKILLED_NURSING_FACILITY): Payer: PRIVATE HEALTH INSURANCE | Admitting: Internal Medicine

## 2013-11-21 DIAGNOSIS — I482 Chronic atrial fibrillation, unspecified: Secondary | ICD-10-CM

## 2013-11-21 DIAGNOSIS — I69359 Hemiplegia and hemiparesis following cerebral infarction affecting unspecified side: Secondary | ICD-10-CM

## 2013-11-21 DIAGNOSIS — K5900 Constipation, unspecified: Secondary | ICD-10-CM

## 2013-11-21 DIAGNOSIS — I4891 Unspecified atrial fibrillation: Secondary | ICD-10-CM

## 2013-11-21 DIAGNOSIS — I69959 Hemiplegia and hemiparesis following unspecified cerebrovascular disease affecting unspecified side: Secondary | ICD-10-CM

## 2013-11-25 DIAGNOSIS — E43 Unspecified severe protein-calorie malnutrition: Secondary | ICD-10-CM | POA: Insufficient documentation

## 2013-11-25 DIAGNOSIS — I69959 Hemiplegia and hemiparesis following unspecified cerebrovascular disease affecting unspecified side: Secondary | ICD-10-CM | POA: Insufficient documentation

## 2013-11-25 DIAGNOSIS — F0150 Vascular dementia without behavioral disturbance: Secondary | ICD-10-CM | POA: Insufficient documentation

## 2013-11-25 DIAGNOSIS — G4700 Insomnia, unspecified: Secondary | ICD-10-CM | POA: Insufficient documentation

## 2013-11-25 NOTE — Progress Notes (Signed)
This encounter was created in error - please disregard.

## 2013-11-25 NOTE — Progress Notes (Signed)
Patient ID: Shawn Woodard, male   DOB: 1960/07/29, 53 y.o.   MRN: [id number]    Facility: [facility]   Code Status: full code  No Known Allergies  Chief Complaint: new admission  HPI:   53 y/o male patient is here for long term care. He is seen today for RV. He has MS, CVA with hemiplegia and is at his baseline. He is alert and in his bed. His communication is limited with his aphasia. He has no concerns this visit. No new concern from nursing staff. He can make his needs known and is comfortable  Review of Systems:  Constitutional: Negative for fever, chills, diaphoresis.  HENT: Negative for congestion, hearing loss and sore throat.   Respiratory: Negative for cough, sputum production, shortness of breath and wheezing.   Cardiovascular: Negative for chest pain, palpitations, orthopnea and leg swelling.  Skin: Negative for itching and rash.  Neurological: Negative for dizziness, tingling, headaches.  Psychiatric/Behavioral: has memory loss   Medication reviewed. See [facility]  Physical exam BP 130/66  Pulse 68  Temp(Src) 98 F (36.7 C)  Resp 18  General- adult male in no acute distress. Aphasia but is able to is coherent and can comprehend Head- atraumatic, normocephalic, normal dentition Eyes- PERRLA, EOMI, no pallor, no icterus, no discharge Neck- no lymphadenopathy, no thyromegaly, no jugular vein distension Throat- moist mucus membrane, normal oropharynx Cardiovascular- normal s1,s2, no murmurs Respiratory- bilateral clear to auscultation, no wheeze, no rhonchi, no crackles, no use of accessory muscles Abdomen- bowel sounds present, soft, non tender, no CVA tenderness Musculoskeletal- right hemiparesis, able to move left side, right hand flaccid, no spinal and paraspinal tenderness, on wheelchair, no leg edema, hoyer transfer Neurological- no focal deficit Skin- warm and dry Psychiatry- alert and normal mood  Labs- 08/06/13 wbc 5.2, hb  14.9, hct 45.4, plt 298, na 136, k 4.4, cl 100, co2 32, bun 11, cr 0.8, glu 82, ca 8.6, t.chol 175, tg 91, ldl 121. hdl 36, a1c 5.4   Assessment/plan  cva with right sided hemiplegia bp stable. Off all medication. Not on any statin or any antiplatelet agent at present. Continue OT for restorative functions.   Protein calorie malnutrition On nectar thick diet and needs assistance with meals. Monitor weight. Continue nutrition supplements. Aspiration precautions  Vit d def Continue vit d on weekly basis, check vit d, fall precautions

## 2013-11-25 NOTE — Progress Notes (Signed)
Patient ID: Shawn Woodard, male   DOB: 12-Jun-1960, 53 y.o.   MRN: [id number]    Facility[facility]  Code Status: full code  No Known Allergies  Chief Complaint: routine visit  HPI:   53 y/o male patient is seen for routine visit today. He has history of multiple sclerosis, dysphagia and dementia. He is seen in his room today. He is alert and in his bed. His communication is limited with his aphasia. He has no concerns this visit. No new concern from nursing staff.  Review of Systems: his dementia and aphasia limits his history taking Respiratory: Negative for cough, sputum production, shortness of breath and wheezing.   Cardiovascular: Negative for chest pain, palpitations, orthopnea and leg swelling.  Gastrointestinal: Negative for heartburn, nausea, vomiting, abdominal pain, diarrhea and constipation.  Genitourinary: Negative for dysuria, urgency, frequency, hematuria and flank pain.  Musculoskeletal: Negative for myalgias.  Skin: Negative for itching and rash.   Medication reviewed. See [facility]  Physical exam BP 128/70  Pulse 88  Temp(Src) 98 F (36.7 C)  Resp 16  General- adult male in no acute distress. Aphasia but is able to is coherent and can comprehend Cardiovascular- normal s1,s2, no murmurs Respiratory- bilateral clear to auscultation, no wheeze, no rhonchi, no crackles, no use of accessory muscles Abdomen- bowel sounds present, soft, non tender, no CVA tenderness Musculoskeletal- right hemiparesis, able to move left side, right hand flaccid, no spinal and paraspinal tenderness, on wheelchair, no leg edema, hoyer transfer Neurological- no focal deficit Skin- warm and dry Psychiatry- alert and normal mood  Labs- 08/06/13 wbc 5.2, hb 14.9, hct 45.4, plt 298, na 136, k 4.4, cl 100, co2 32, bun 11, cr 0.8, glu 82, ca 8.6, t.chol 175, tg 91, ldl 121. hdl 36, a1c 5.4  Assessment/plan  cva with right sided hemiplegia bp stable.  Off all medications and anticoagulation. Aspiration precautions and dysphagia diet. asisstance with feed. Skin care and fall precautions  afib Rate controlled. Currently off all medications. Not on any anticoagulation  Constipation Continue senna s bid and miralax daily and monitor for now

## 2013-11-25 NOTE — Progress Notes (Signed)
Patient ID: Shawn Woodard, male   DOB: 1960-11-05, 53 y.o.   MRN: [id number]    Facility: [facility]  Code Status: full code  No Known Allergies  Chief Complaint: RV  HPI:   53 y/o male patient is here for long term care and seen today for RV. he has cva with hemiplegia and dysarthria with dysphagia. He also has MS and follows with neurology. No new concerns for him from staff. Is under total care.  Review of Systems:  Constitutional: Negative for fever, chills, diaphoresis.  Respiratory: Negative for cough, sputum production, shortness of breath and wheezing.   Cardiovascular: Negative for chest pain, palpitations, orthopnea and leg swelling.  Gastrointestinal: Negative for heartburn, nausea, vomiting, abdominal pain, diarrhea and constipation.  Genitourinary: Negative for dysuria, urgency, frequency, hematuria and flank pain.  Musculoskeletal: Negative for myalgias.  Skin: Negative for itching and rash.  Neurological: Negative for dizziness, tingling, headaches.  Psychiatric/Behavioral: has memory loss   Able to provide ROS in yes and no  Medication reviewed. See [facility]  Physical exam BP 130/74  Pulse 74  Temp(Src) 98 F (36.7 C)  Resp 18  SpO2 97%  General- adult male in no acute distress. Aphasia but is able to is coherent and can comprehend Head- atraumatic, normocephalic, normal dentition Neck- no lymphadenopathy, no thyromegaly, no jugular vein distension Cardiovascular- normal s1,s2, no murmurs Respiratory- bilateral clear to auscultation, no wheeze, no rhonchi, no crackles, no use of accessory muscles Abdomen- bowel sounds present, soft, non tender, no CVA tenderness Musculoskeletal- right hemiparesis, able to move left side, right hand flaccid, no spinal and paraspinal tenderness, on wheelchair, no leg edema, hoyer transfer, contracture at right elbow Neurological- no focal deficit Skin- warm and dry Psychiatry- alert and  normal mood  Labs- 08/06/13 wbc 5.2, hb 14.9, hct 45.4, plt 298, na 136, k 4.4, cl 100, co2 32, bun 11, cr 0.8, glu 82, ca 8.6, t.chol 175, tg 91, ldl 121. hdl 36, a1c 5.4  Assessment/plan  Dementia Mixed with his MS, CVA and decline anticiapted. Monitor weight, po intake. Continue skin care and fall precautions. Under total care  Insomnia Continue his melatonin, monitor  MS Continue MVI and vit d supplement, followed by neurology, monitor bowel movement and continue stool softener. Continue PT, OT and ST for PROM and splinting.

## 2013-12-04 ENCOUNTER — Non-Acute Institutional Stay (SKILLED_NURSING_FACILITY): Payer: Medicare Other | Admitting: Adult Health

## 2013-12-04 DIAGNOSIS — R1319 Other dysphagia: Secondary | ICD-10-CM

## 2013-12-04 DIAGNOSIS — I482 Chronic atrial fibrillation, unspecified: Secondary | ICD-10-CM

## 2013-12-04 DIAGNOSIS — K5900 Constipation, unspecified: Secondary | ICD-10-CM

## 2013-12-04 DIAGNOSIS — I69959 Hemiplegia and hemiparesis following unspecified cerebrovascular disease affecting unspecified side: Secondary | ICD-10-CM

## 2013-12-04 DIAGNOSIS — G35 Multiple sclerosis: Secondary | ICD-10-CM

## 2013-12-04 DIAGNOSIS — I4891 Unspecified atrial fibrillation: Secondary | ICD-10-CM

## 2013-12-04 DIAGNOSIS — F0150 Vascular dementia without behavioral disturbance: Secondary | ICD-10-CM

## 2013-12-04 DIAGNOSIS — R627 Adult failure to thrive: Secondary | ICD-10-CM

## 2013-12-09 ENCOUNTER — Encounter: Payer: Self-pay | Admitting: Adult Health

## 2013-12-09 DIAGNOSIS — R627 Adult failure to thrive: Secondary | ICD-10-CM | POA: Insufficient documentation

## 2013-12-09 NOTE — Progress Notes (Signed)
Patient ID: Shawn Woodard, male   DOB: 1960-08-11, 53 y.o.   MRN: [id number]     [facility]  No Known Allergies   Chief Complaint  Patient presents with  . Medical Management of Chronic Issues    HPI:  He is a long term resident of this facility being seen for the management of his chronic illnesses. He is now being followed by hospice care. There are no concerns being voiced by the nursing staff at this time. He is unable to fully participate in the hpi or ros. He is able to nod yes at times; he is aware of his surroundings and those around him.     Past Medical History  Diagnosis Date  . MS (multiple sclerosis)   . Gait disorder   . Dementia   . Neurogenic bladder   . Vitamin D deficiency   . 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 Multiple sclerosis 04/12/2013  . Spastic quadriparesis   . Dementia     Past Surgical History  Procedure Laterality Date  . None      VITAL SIGNS BP 136/72  Pulse 78  Ht 6' (1.829 m)  Wt 138 lb 3.2 oz (62.687 kg)  BMI 18.74 kg/m2   Patient's Medications  New Prescriptions   No medications on file  Previous Medications   ACETAMINOPHEN (TYLENOL) 325 MG TABLET    Take 650 mg by mouth every 6 (six) hours as needed.   BACLOFEN (LIORESAL) 10 MG TABLET    One half tablet 3 times daily for one month, then take one full tablet 3 times daily   MELATONIN 1 MG TABS    Take 1 mg by mouth at bedtime.    MULTIPLE VITAMIN (MULTIVITAMIN) TABLET    Take 1 tablet by mouth daily.   SENNOSIDES-DOCUSATE SODIUM (SENOKOT-S) 8.6-50 MG TABLET    Take 2 tablets by mouth daily.    VITAMIN D, ERGOCALCIFEROL, (DRISDOL) 50000 UNITS CAPS CAPSULE    Take 50,000 Units by mouth every 30 (thirty) days.   Modified Medications   No medications on file  Discontinued Medications   POLYETHYLENE GLYCOL 3350 POWD    3,350 g by Does not apply route.    SIGNIFICANT DIAGNOSTIC EXAMS     LABS REVIEWED:  08-06-13: wbc 5.2; hgb 14.9;  hct 45.9; mcv 84.2; plt 298; glucose 82; bun 11; creat 0.8; k+4.4; na+136;liver normal albumin 3.4; chol 175 ;ldl 121; trig 91; hgb a1c 5.4; vit d 31.22     Review of Systems  Unable to perform ROS    Physical Exam  Constitutional: No distress.  frail  Neck: Neck supple. No JVD present.  Cardiovascular: Normal rate, regular rhythm and intact distal pulses.   Respiratory: Effort normal and breath sounds normal. No respiratory distress. He has no wheezes.  GI: Soft. Bowel sounds are normal. He exhibits no distension. There is no tenderness.  Musculoskeletal: He exhibits no edema.  Right hand flaccid; is able to left side extremities.   Neurological: He is alert.  Skin: Skin is warm and dry. He is not diaphoretic.      ASSESSMENT/ PLAN:  1. Constipation: due to his being on nectar thick liquids; the miralax has been stopped and will begin prunes daily will continue senna s 2 tabs twice daily and will monitor   2. Old cva with hemiplegia: no change in his neurological status; he is presently taking baclofen 10 mg three times daily for spasticity; will not  make changes will monitor  3. MS: he has lost weight this year with his current weight at 138.2 pounds. He does continue to decline per the nursing staff. He will continue to be followed by hospice care; will continue restorative nursing as directed and will monitor   4. Dysphagia: no signs of aspiration present; will continue nectar thick liquids; his family does not desire for a feeding tube.   5. Afib: his heart rate is stable at this time. He is not on medications; will not make changes will monitor his status.   6. FTT/protein calorie malnutrition: his present weight is 138.2 pounds; will continue his current plan of care a this time; the goal of his is more comfort directed at this time.   7. 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

## 2014-01-03 ENCOUNTER — Non-Acute Institutional Stay (SKILLED_NURSING_FACILITY): Payer: Medicare Other | Admitting: Adult Health

## 2014-01-03 DIAGNOSIS — R627 Adult failure to thrive: Secondary | ICD-10-CM

## 2014-01-03 DIAGNOSIS — I482 Chronic atrial fibrillation, unspecified: Secondary | ICD-10-CM

## 2014-01-03 DIAGNOSIS — I69951 Hemiplegia and hemiparesis following unspecified cerebrovascular disease affecting right dominant side: Secondary | ICD-10-CM

## 2014-01-03 DIAGNOSIS — K5900 Constipation, unspecified: Secondary | ICD-10-CM

## 2014-01-03 DIAGNOSIS — I69391 Dysphagia following cerebral infarction: Secondary | ICD-10-CM

## 2014-01-03 DIAGNOSIS — I69359 Hemiplegia and hemiparesis following cerebral infarction affecting unspecified side: Secondary | ICD-10-CM

## 2014-01-03 DIAGNOSIS — G35 Multiple sclerosis: Secondary | ICD-10-CM

## 2014-01-05 ENCOUNTER — Encounter: Payer: Self-pay | Admitting: Adult Health

## 2014-01-05 DIAGNOSIS — I69951 Hemiplegia and hemiparesis following unspecified cerebrovascular disease affecting right dominant side: Secondary | ICD-10-CM | POA: Insufficient documentation

## 2014-01-05 DIAGNOSIS — K5900 Constipation, unspecified: Secondary | ICD-10-CM | POA: Insufficient documentation

## 2014-01-05 DIAGNOSIS — I69391 Dysphagia following cerebral infarction: Secondary | ICD-10-CM | POA: Insufficient documentation

## 2014-01-05 NOTE — Progress Notes (Signed)
Patient ID: Shawn Woodard, male   DOB: 05/10/1960, 53 y.o.   MRN: [id number]     [facility]  No Known Allergies   Chief Complaint  Patient presents with  . Medical Management of Chronic Issues    HPI:  He is being seen for the management of his chronic illnesses. He continues to be followed by hospice care. He is slowly losing weight. His current weight is 136.5 pounds. He is unable to fully participate in the hpi or ros. There are no nursing concerns being voiced at this time. There are no signs of distress or pain present.    Past Medical History  Diagnosis Date  . MS (multiple sclerosis)   . Gait disorder   . Dementia   . Neurogenic bladder   . Vitamin D deficiency   . 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 Multiple sclerosis 04/12/2013  . Spastic quadriparesis   . Dementia     Past Surgical History  Procedure Laterality Date  . None      VITAL SIGNS BP 110/62  Pulse 70  Ht 6' (1.829 m)  Wt 136 lb 8 oz (61.916 kg)  BMI 18.51 kg/m2   Patient's Medications  New Prescriptions   No medications on file  Previous Medications   ACETAMINOPHEN (TYLENOL) 325 MG TABLET    Take 650 mg by mouth every 6 (six) hours as needed.   BACLOFEN (LIORESAL) 10 MG TABLET    One half tablet 3 times daily for one month, then take one full tablet 3 times daily   FOOD THICKENER (THICK IT) POWD    Take 1 Container by mouth as needed. NECTAR THICK   MELATONIN 1 MG TABS    Take 1 mg by mouth at bedtime.    MULTIPLE VITAMIN (MULTIVITAMIN) TABLET    Take 1 tablet by mouth daily.   SENNOSIDES-DOCUSATE SODIUM (SENOKOT-S) 8.6-50 MG TABLET    Take 2 tablets by mouth daily.   Modified Medications   No medications on file  Discontinued Medications   VITAMIN D, ERGOCALCIFEROL, (DRISDOL) 50000 UNITS CAPS CAPSULE    Take 50,000 Units by mouth every 30 (thirty) days.     SIGNIFICANT DIAGNOSTIC EXAMS    LABS REVIEWED:  08-06-13: wbc 5.2; hgb 14.9; hct 45.9;  mcv 84.2; plt 298; glucose 82; bun 11; creat 0.8; k+4.4; na+136;liver normal albumin 3.4; chol 175 ;ldl 121; trig 91; hgb a1c 5.4; vit d 31.22     Review of Systems  Unable to perform ROS    Physical Exam  Constitutional: No distress.  frail  Neck: Neck supple. No JVD present.  Cardiovascular: Normal rate, regular rhythm and intact distal pulses.   Respiratory: Effort normal and breath sounds normal. No respiratory distress. He has no wheezes.  GI: Soft. Bowel sounds are normal. He exhibits no distension. There is no tenderness.  Musculoskeletal: He exhibits no edema.  Right hand flaccid; is able to left side extremities.   Neurological: He is alert.  Skin: Skin is warm and dry. He is not diaphoretic.      ASSESSMENT/ PLAN:  1. Constipation: due to his being on nectar thick liquids; the miralax has been stopped and will begin prunes daily will continue senna s 2 tabs twice daily and will monitor   2. Old cva with hemiplegia: no change in his neurological status; he is presently taking baclofen 10 mg three times daily for spasticity; will not make changes will monitor  3. MS:  he continues to lose weight  with his current weight at 136.5 pounds. He does continue to decline per the nursing staff. He will continue to be followed by hospice care; will continue restorative nursing as directed and will monitor   4. Dysphagia: no signs of aspiration present; will continue nectar thick liquids; his family does not desire for a feeding tube.   5. Afib: his heart rate is stable at this time. He is not on medications; will not make changes will monitor his status.   6. FTT/protein calorie malnutrition: his present weight is 136.5 pounds; will continue his current plan of care a this time; the goal of his is more comfort directed at this time.   7. 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

## 2014-02-06 ENCOUNTER — Non-Acute Institutional Stay (SKILLED_NURSING_FACILITY): Payer: Medicare Other | Admitting: Adult Health

## 2014-02-06 DIAGNOSIS — G35 Multiple sclerosis: Secondary | ICD-10-CM

## 2014-02-06 DIAGNOSIS — R627 Adult failure to thrive: Secondary | ICD-10-CM

## 2014-02-06 DIAGNOSIS — I69391 Dysphagia following cerebral infarction: Secondary | ICD-10-CM

## 2014-02-06 DIAGNOSIS — I482 Chronic atrial fibrillation, unspecified: Secondary | ICD-10-CM

## 2014-02-06 DIAGNOSIS — F0150 Vascular dementia without behavioral disturbance: Secondary | ICD-10-CM

## 2014-02-06 DIAGNOSIS — I69951 Hemiplegia and hemiparesis following unspecified cerebrovascular disease affecting right dominant side: Secondary | ICD-10-CM

## 2014-02-17 ENCOUNTER — Encounter: Payer: Self-pay | Admitting: Adult Health

## 2014-02-17 NOTE — Progress Notes (Signed)
Patient ID: Shawn Woodard, male   DOB: 10-18-1960, 53 y.o.   MRN: [id number]     No Known Allergies     Chief Complaint  Patient presents with  . Medical Management of Chronic Issues    HPI:  He is a long term resident of this facility being seen for the management of his chronic illnesses. He continue to be followed by hospice care. He is unable to participate in the hpi or ros. There are no nursing concerns being voiced at this time. His family remains involved in his care.    Past Medical History  Diagnosis Date  . MS (multiple sclerosis)   . Gait disorder   . Dementia   . Neurogenic bladder   . Vitamin D deficiency   . 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 Multiple sclerosis 04/12/2013  . Spastic quadriparesis   . Dementia     Past Surgical History  Procedure Laterality Date  . None      VITAL SIGNS BP 130/72 mmHg  Pulse 74  Ht 6' (1.829 m)  Wt 138 lb 6.4 oz (62.778 kg)  BMI 18.77 kg/m2   Outpatient Encounter Prescriptions as of 02/06/2014  Medication Sig  . acetaminophen (TYLENOL) 325 MG tablet Take 650 mg by mouth every 6 (six) hours as needed.  . baclofen (LIORESAL) 10 MG tablet One half tablet 3 times daily for one month, then take one full tablet 3 times daily  . food thickener (THICK IT) POWD Take 1 Container by mouth as needed. NECTAR THICK  . Melatonin 1 MG TABS Take 1 mg by mouth at bedtime.   . Multiple Vitamin (MULTIVITAMIN) tablet Take 1 tablet by mouth daily.  . sennosides-docusate sodium (SENOKOT-S) 8.6-50 MG tablet Take 2 tablets by mouth daily.      SIGNIFICANT DIAGNOSTIC EXAMS   LABS REVIEWED:  08-06-13: wbc 5.2; hgb 14.9; hct 45.9; mcv 84.2; plt 298; glucose 82; bun 11; creat 0.8; k+4.4; na+136;liver normal albumin 3.4; chol 175 ;ldl 121; trig 91; hgb a1c 5.4; vit d 31.22      Review of Systems  Unable to perform ROS    Physical Exam  Constitutional: No distress.  frail  Neck: Neck supple.  No JVD present.  Cardiovascular: Normal rate, regular rhythm and intact distal pulses.   Respiratory: Effort normal and breath sounds normal. No respiratory distress. He has no wheezes.  GI: Soft. Bowel sounds are normal. He exhibits no distension. There is no tenderness.  Musculoskeletal: He exhibits no edema.  Right hand flaccid; is able to left side extremities.   Neurological: He is alert.  Skin: Skin is warm and dry. He is not diaphoretic.    ASSESSMENT/ PLAN:   1. Constipation:  will continue senna s 2 tabs twice daily and will monitor   2. Old cva with hemiplegia: no change in his neurological status; he is presently taking baclofen 10 mg three times daily for spasticity; will not make changes will monitor  3. MS: he continues to lose weight  with his current weight at 136.5 pounds. He does continue to decline per the nursing staff. He will continue to be followed by hospice care; will continue restorative nursing as directed and will monitor   4. Dysphagia: no signs of aspiration present; will continue nectar thick liquids; his family does not desire for a feeding tube.   5. Afib: his heart rate is stable at this time. He is not on medications; will not  make changes will monitor his status.   6. FTT/protein calorie malnutrition: his present weight is 138.4 pounds; will continue his current plan of care a this time; the goal of his is more comfort directed at this time.   7. 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      Synthia Innocent NP [facility]  Contact [phone] Monday through Friday 8am- 5pm  After hours call [phone]

## 2014-03-04 ENCOUNTER — Non-Acute Institutional Stay (SKILLED_NURSING_FACILITY): Payer: Medicare Other | Admitting: Registered Nurse

## 2014-03-04 ENCOUNTER — Encounter: Payer: Self-pay | Admitting: Registered Nurse

## 2014-03-04 DIAGNOSIS — I69391 Dysphagia following cerebral infarction: Secondary | ICD-10-CM

## 2014-03-04 DIAGNOSIS — K5900 Constipation, unspecified: Secondary | ICD-10-CM

## 2014-03-04 DIAGNOSIS — F0150 Vascular dementia without behavioral disturbance: Secondary | ICD-10-CM

## 2014-03-04 DIAGNOSIS — I69951 Hemiplegia and hemiparesis following unspecified cerebrovascular disease affecting right dominant side: Secondary | ICD-10-CM

## 2014-03-04 DIAGNOSIS — R627 Adult failure to thrive: Secondary | ICD-10-CM

## 2014-03-04 DIAGNOSIS — G4700 Insomnia, unspecified: Secondary | ICD-10-CM

## 2014-03-04 NOTE — Progress Notes (Signed)
Patient ID: Shawn Woodard, male   DOB: 04-03-1961, 53 y.o.   MRN: [id number]   Place of Service: [facility]  No Known Allergies  Code Status: DNR  Goals of Care: Comfort and Quality of Life/Hospice  Chief Complaint  Patient presents with  . 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 He is under hospice service. Current weight is 139.4 lb, up about 3 lb from last month. No recent fall or skin concerns reported. No change in behaviors or functional status reported. No concerns from staff.   Review of Systems Unable to obtain  Past Medical History  Diagnosis Date  . MS (multiple sclerosis)   . Gait disorder   . Dementia   . Neurogenic bladder   . Vitamin D deficiency   . 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 Multiple sclerosis 04/12/2013  . Spastic quadriparesis   . Dementia     Past Surgical History  Procedure Laterality Date  . None      History   Social History  . Marital Status: Divorced    Spouse Name: N/A    Number of Children: 2  . Years of Education: COLLEGE   Occupational History  . RETIRED    Social History Main Topics  . Smoking status: Never Smoker   . Smokeless tobacco: Not on file  . Alcohol Use: No  . Drug Use: No  . Sexual Activity: Not on file   Other Topics Concern  . Not on file   Social History Narrative      Medication List       This list is accurate as of: 03/04/14  8:21 PM.  Always use your most recent med list.               acetaminophen 325 MG tablet  Commonly known as:  TYLENOL  Take 650 mg by mouth every 6 (six) hours as needed.     baclofen 10 MG tablet  Commonly known as:  LIORESAL  One half tablet 3 times daily  for one month, then take one full tablet 3 times daily     food thickener Powd  Commonly known as:  THICK IT  Take 1 Container by mouth as needed. NECTAR THICK     Melatonin 1 MG Tabs  Take 1 mg by mouth at bedtime.     multivitamin tablet  Take 1 tablet by mouth daily.     sennosides-docusate sodium 8.6-50 MG tablet  Commonly known as:  SENOKOT-S  Take 2 tablets by mouth daily.        Physical Exam Filed Vitals:   03/04/14 2018  BP: 121/74  Pulse: 80  Temp: 96.7 F (35.9 C)  Resp: 16   Constitutional: frail adult male in no acute distress. Pleasant. Appears comfortable in chair. HEENT: Normocephalic and atraumatic. PERRL. EOM intact. No icterus. Excessive salivation noted.  Neck: Supple and nontender. No lymphadenopathy, masses, or thyromegaly. No JVD or carotid bruits. Cardiac: Normal S1, S2. RRR without appreciable murmurs, rubs, or gallops. Distal pulses intact. No dependent edema.  Lungs: No respiratory distress. Breath sounds clear bilaterally without rales, rhonchi, or wheezes. Abdomen: Audible bowel sounds in all quadrants. Soft,  nontender, nondistended.  Musculoskeletal: Able to move left extremities. Right hemiplegia with contracture of RUE. No joint erythema or tenderness. Skin: Warm and dry. No rash noted. No erythema.  Neurological: Alert  Psychiatric: Appropriate mood and affect.   Labs Reviewed  CBC Latest Ref Rng 08/06/2013 10/16/2009 10/15/2009  WBC - 5.2 10.5 15.1(H)  Hemoglobin 13.5 - 17.5 g/dL 16.114.9 11.4(L) 12.6(L)  Hematocrit 41 - 53 % 46 33.5(L) 37.6(L)  Platelets 150 - 399 K/L 298 202 171    CMP Latest Ref Rng 08/06/2013 10/15/2009 10/14/2009  Glucose 70 - 99 mg/dL - 096(E102(H) 92  BUN 4 - 21 mg/dL 11 9 11   Creatinine 0.6 - 1.3 mg/dL 0.8 4.541.06 0.981.43  Sodium 119137 - 147 mmol/L 136(A) 133(L) 136  Potassium 3.4 - 5.3 mmol/L 4.4 3.9 DELTA CHECK NOTED 3.1 DELTA CHECK NOTED REPEATED TO VERIFY(L)  Chloride 96 - 112 mEq/L - 102 103  CO2 19 - 32 mEq/L - 26 24    Calcium 8.4 - 10.5 mg/dL - 7.6(L) 7.6(L)  Alkaline Phos 25 - 125 U/L 97 - -  AST 14 - 40 U/L 14 - -  ALT 10 - 40 U/L 13 - -    Lab Results  Component Value Date   HGBA1C 5.4 08/06/2013    Lab Results  Component Value Date   TSH 0.515 10/13/2009    Lipid Panel     Component Value Date/Time   CHOL 175 08/06/2013   TRIG 91 08/06/2013   LDLCALC 121 08/06/2013    Assessment & Plan  1. 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 Continue baclofen 10mg  TID for spasticity and tylenol 650mg  Q6H prn pain. Continue to monitor  2. Dysphagia as late effect of stroke Stable. Continue dysphagia diet and aspiration precautions. Continue to monitor.   3. FTT (failure to thrive) in adult Weight stable. Continue to monitor for now as goal of care is comfort-directed.  4. Vascular dementia, without behavioral disturbance Persists with decline anticipated. Continue to monitor for change in behaviors.   5. Insomnia Stable. Continue melatonin 1mg  daily at bedtime and monitor  6. Constipation, unspecified constipation type Stable. Continue senna 2 tabs twice daily and monitor.   Diagnostic Studies/Labs Ordered: CBC with diff, CMP, TSH   Family/Staff Communication Plan of care discussed with resident and nursing staff. Resident and nursing staff verbalized understanding and agree with plan of care. No additional questions or concerns reported.    Loura BackKim Daionna Crossland, MSN, AGNP-C [facility] [address] [place], [place] [address] [phone] [8am-5pm] After hours: [phone]

## 2014-03-05 LAB — BASIC METABOLIC PANEL
BUN: 12 mg/dL (ref 4–21)
Creatinine: 0.8 mg/dL (ref 0.6–1.3)
Glucose: 84 mg/dL
Potassium: 4.6 mmol/L (ref 3.4–5.3)
Sodium: 141 mmol/L (ref 137–147)

## 2014-03-05 LAB — CBC AND DIFFERENTIAL
HEMATOCRIT: 44 % (ref 41–53)
Hemoglobin: 14.7 g/dL (ref 13.5–17.5)
Platelets: 258 10*3/uL (ref 150–399)
WBC: 6.6 10^3/mL

## 2014-04-09 ENCOUNTER — Encounter: Payer: Self-pay | Admitting: Registered Nurse

## 2014-04-09 ENCOUNTER — Non-Acute Institutional Stay (SKILLED_NURSING_FACILITY): Payer: Medicare Other | Admitting: Registered Nurse

## 2014-04-09 DIAGNOSIS — R627 Adult failure to thrive: Secondary | ICD-10-CM

## 2014-04-09 DIAGNOSIS — I69391 Dysphagia following cerebral infarction: Secondary | ICD-10-CM

## 2014-04-09 DIAGNOSIS — I69951 Hemiplegia and hemiparesis following unspecified cerebrovascular disease affecting right dominant side: Secondary | ICD-10-CM

## 2014-04-09 DIAGNOSIS — G4700 Insomnia, unspecified: Secondary | ICD-10-CM

## 2014-04-09 DIAGNOSIS — K5900 Constipation, unspecified: Secondary | ICD-10-CM

## 2014-04-09 DIAGNOSIS — F0150 Vascular dementia without behavioral disturbance: Secondary | ICD-10-CM

## 2014-04-09 NOTE — Progress Notes (Signed)
Patient ID: Shawn Woodard, male   DOB: [birth year], 54 y.o.   MRN: [id number]   Place of Service: [facility]  No Known Allergies  Code Status: DNR  Goals of Care: Comfort and Quality of Life/Hospice  Chief Complaint  Patient presents with  . Medical Management of Chronic Issues    dementia, hemiplegia s/p cva, dysphagia, FTT, insomnia, constipation    HPI 54 y.o. 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 Weight stable since last routine visit. No recent fall or skin concerns reported. No change in behaviors or functional status reported. No concerns from staff. Seen in room today. No complaints verbalized from patient. Constipation is stable with senna s. Sleeps well with melatonin. Spasms and pain  associated with old cva is well controlled with baclofen and prn tylenol.   Review of Systems Unable to obtain due to dementia  Past Medical History  Diagnosis Date  . MS (multiple sclerosis)   . Gait disorder   . Dementia   . Neurogenic bladder   . Vitamin D deficiency   . 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 Multiple sclerosis 04/12/2013  . Spastic quadriparesis   . Dementia     Past Surgical History  Procedure Laterality Date  . None      History   Social History  . Marital Status: Divorced    Spouse Name: N/A    Number of Children: 2  . Years of Education: COLLEGE   Occupational History  . RETIRED    Social History Main Topics  . Smoking status: Never Smoker   . Smokeless tobacco: Not on file  . Alcohol Use: No  . Drug Use: No  . Sexual Activity: Not on file   Other Topics Concern  . Not on file   Social History Narrative      Medication List       This list is accurate as of: 04/09/14  5:23 PM.  Always use your most recent med list.                 acetaminophen 325 MG tablet  Commonly known as:  TYLENOL  Take 650 mg by mouth every 6 (six) hours as needed.     baclofen 10 MG tablet  Commonly known as:  LIORESAL  One half tablet 3 times daily for one month, then take one full tablet 3 times daily     food thickener Powd  Commonly known as:  THICK IT  Take 1 Container by mouth as needed. NECTAR THICK     Melatonin 1 MG Tabs  Take 1 mg by mouth at bedtime.     multivitamin tablet  Take 1 tablet by mouth daily.     sennosides-docusate sodium 8.6-50 MG tablet  Commonly known as:  SENOKOT-S  Take 2 tablets by mouth daily.        Physical Exam Filed Vitals:   04/09/14 1719  BP: 139/88  Pulse: 72  Temp: 98.6 F (37 C)  Resp: 20   Constitutional: frail adult male in no acute distress. Pleasant. Appears comfortable in chair. HEENT: Normocephalic and atraumatic. PERRL. EOM intact. No icterus. Neck: Supple and nontender. No lymphadenopathy, masses, or thyromegaly. No JVD or carotid bruits. Cardiac: Normal S1, S2. RRR without appreciable murmurs, rubs, or gallops. Distal pulses  intact. No dependent edema.  Lungs: No respiratory distress. Breath sounds clear bilaterally without rales, rhonchi, or wheezes. Abdomen: Audible bowel sounds in all quadrants. Soft, nontender, nondistended.  Musculoskeletal: Able to move left extremities. Right hemiplegia with contracture of RUE. No joint erythema or tenderness. Skin: Warm and dry. No rash noted. No erythema.  Neurological: Alert  Psychiatric: Appropriate mood and affect.   Labs Reviewed  CBC Latest Ref Rng 03/05/2014 08/06/2013 10/16/2009  WBC - 6.6 5.2 10.5  Hemoglobin 13.5 - 17.5 g/dL 80.2 23.3 11.4(L)  Hematocrit 41 - 53 % 44 46 33.5(L)  Platelets 150 - 399 K/L 258 298 202    CMP Latest Ref Rng 03/05/2014 08/06/2013 10/15/2009  Glucose 70 - 99 mg/dL - - 612(A)  BUN 4 - 21 mg/dL 12 11 9   Creatinine 0.6 - 1.3 mg/dL 0.8 0.8 4.49  Sodium 753 - 147 mmol/L 141 136(A) 133(L)   Potassium 3.4 - 5.3 mmol/L 4.6 4.4 3.9 DELTA CHECK NOTED  Chloride 96 - 112 mEq/L - - 102  CO2 19 - 32 mEq/L - - 26  Calcium 8.4 - 10.5 mg/dL - - 7.6(L)  Alkaline Phos 25 - 125 U/L - 97 -  AST 14 - 40 U/L - 14 -  ALT 10 - 40 U/L - 13 -    Assessment & Plan  1. 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 Continue baclofen 10mg  three times daily for spasticity and tylenol 650mg  every six hours as needed for pain. Continue to monitor  2. Dysphagia as late effect of stroke Stable. Continue dysphagia diet and aspiration precautions.   3. FTT (failure to thrive) in adult Weight stable. Continue to monitor  4. Vascular dementia, without behavioral disturbance Stable with decline anticipated. Continue to monitor for change in behaviors. Fall risk and pressure ulcer precautions  5. Insomnia Stable. Continue melatonin 1mg  daily at bedtime and monitor  6. Constipation, unspecified constipation type No issues. Continue senna 2 tabs twice daily and monitor. Encourage hydration.    Family/Staff Communication Plan of care discussed with resident and nursing staff. Resident and nursing staff verbalized understanding and agree with plan of care. No additional questions or concerns reported.    Loura Back, MSN, AGNP-C [facility] [address] [place], [place] [address] [phone] [8am-5pm] After hours: [phone]

## 2014-04-14 ENCOUNTER — Ambulatory Visit: Payer: Medicare Other | Admitting: Nurse Practitioner

## 2014-05-14 ENCOUNTER — Encounter: Payer: Self-pay | Admitting: Adult Health

## 2014-05-14 ENCOUNTER — Ambulatory Visit (INDEPENDENT_AMBULATORY_CARE_PROVIDER_SITE_OTHER): Payer: Medicare Other | Admitting: Adult Health

## 2014-05-14 VITALS — BP 147/95 | HR 85 | Ht 70.0 in | Wt 140.0 lb

## 2014-05-14 DIAGNOSIS — G35 Multiple sclerosis: Secondary | ICD-10-CM

## 2014-05-14 DIAGNOSIS — H5712 Ocular pain, left eye: Secondary | ICD-10-CM

## 2014-05-14 NOTE — Patient Instructions (Signed)
Continue Baclofen.  Patient complaining of pain in the left eye- no changes in vision. Continue to monitor this. No interventions at this time.

## 2014-05-14 NOTE — Progress Notes (Signed)
I have read the note, and I agree with the clinical assessment and plan.  Breckon Reeves KEITH   

## 2014-05-14 NOTE — Progress Notes (Signed)
PATIENT: Shawn Woodard DOB: Feb 16, 1961  REASON FOR VISIT: follow up- chronic progressive multiple sclerosis.  HISTORY FROM: patient  HISTORY OF PRESENT ILLNESS: Shawn Woodard is a 54 year old right-handed black male with a history of chronic progressive multiple sclerosis. He returns today for follow-up. Patient currently resides at [facility]. He is not on any disease modifying therapy. In the past he has had spasticity in the lower legs. He is on baclofen. He reports this has been helpful. Denies any new numbness or weakness. Patient is nonambulatory. He wears adult diapers. Patient was on hospice but was recently released due to weight gain. He is eating well according to the daughter. Patient is nonverbal. daughter states that he has been complaining of eye pain that started early this morning in the left eye. He denies any blurring vision, double vision or seeing objects. He doesn't feel that it is a headache although the daughter does. No new medical issues since last seen.   HISTORY 08/29/13 (LL): Shawn Woodard is a 54 year old right-handed black male with a history of chronic progressive multiple sclerosis. The patient has continued to progress over time, and he currently is residing in an extended care facility. The patient at times requires assistance with feeding. The patient is on thick liquids, but a regular diet otherwise. The patient has not had any recent falls. The patient is nonambulatory. The patient requires an adult diaper. The patient denies any pain, but he does have flexion contractures of the knees and of the right elbow. The patient has better function of the left arm. The speech has become quite dysarthric, almost unintelligible. No significant skin issues are present. UPDATE 08/29/13 (LL): The patient is residing in [facility]. Shawn Woodard, legal guardian, called to state recently that patient's legs are contracting more - nursing home staff are not doing anything for him. Shawn Woodard  felt something more needs to be done for him. He was transfer from [facility] to [facility].  He was started back on Baclofen, one half tablet 3 times daily for one month, then one full tablet 3 times daily.  He was just started this morning on the 10 mg dose.  Daughter feels better about care and therapy at [facility].  She can see some improvement in his spasticity. Shawn Woodard is a 54 year old male with a history of Dysesthesia of the left arm and hand. She returns today for follow-up. She states that her arm has.. She had repeat NCS with EMG that was normal.  HISTORY 01/10/14: Shawn Woodard is a 54 year old male with a history of Dysesthesia of the left arm and hand. She returns today for follow-up. She was sent for a MRI of the cervical spine which was normal. She was also sent for x-ray of the left wrist due to acute pain and limited ROM. She states that when she went for the x-ray they told her it was canceled so she never got the scan. She states that since then her wrist has gotten better. She no longer has trouble with supination of the wrist but continues to have mild pain and difficulty with flexion and extension. She has been going to the physical therapist who gave her a TEN's unit and she states that it really helps help wrist/arm. She continues to have "dull- sharpe paint that travels up and down her arm." She has had NCS with EMG that was normal. She was sent to a vascular specialist who state that she  does have some damage where the IV infiltrated while she was in the hospital giving birth. They advised her that her arm could potential improve over time. Overall she feels that it has improved since the last visit.    REVIEW OF SYSTEMS: Out of a complete 14 system review of symptoms, the patient complains only of the following symptoms, and all other reviewed systems are negative.  See HPI  ALLERGIES: No Known Allergies  HOME MEDICATIONS: Outpatient Prescriptions Prior to  Visit  Medication Sig Dispense Refill  . acetaminophen (TYLENOL) 325 MG tablet Take 650 mg by mouth every 6 (six) hours as needed.    . baclofen (LIORESAL) 10 MG tablet One half tablet 3 times daily for one month, then take one full tablet 3 times daily 90 each 3  . food thickener (THICK IT) POWD Take 1 Container by mouth as needed. NECTAR THICK    . Melatonin 1 MG TABS Take 1 mg by mouth at bedtime.     . Multiple Vitamin (MULTIVITAMIN) tablet Take 1 tablet by mouth daily.    . sennosides-docusate sodium (SENOKOT-S) 8.6-50 MG tablet Take 2 tablets by mouth daily.      No facility-administered medications prior to visit.    PAST MEDICAL HISTORY: Past Medical History  Diagnosis Date  . MS (multiple sclerosis)   . Gait disorder   . Dementia   . Neurogenic bladder   . Vitamin D deficiency   . 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 Multiple sclerosis 04/12/2013  . Spastic quadriparesis   . Dementia     PAST SURGICAL HISTORY: Past Surgical History  Procedure Laterality Date  . None      FAMILY HISTORY: Family History  Problem Relation Age of Onset  . Stroke Father     PHYSICAL EXAM  Filed Vitals:   05/14/14 0936  BP: 147/95  Pulse: 85  Height: 5' 10"  (1.778 m)  Weight: 140 lb (63.504 kg)   Body mass index is 20.09 kg/(m^2).  Generalized: Well developed, in no acute distress   Neurological examination  Mentation: Alert. Follows all commands. Speech is very limited.  Cranial nerve II-XII: Pupils were equal round reactive to light. Extraocular movements limited in all fields- unsure if this is due to comprehension of directions, visual field were full on confrontational test. Facial sensation and strength were normal.  Motor: patient unable to move lower extremities. Right arm is in flexion unable to extend. Left arm 4/5 strength.   Sensory: Sensory testing is intact to soft touch on all 4 extremities. No evidence of extinction is  noted.  Gait and station: nonambulatory Reflexes: Deep tendon reflexes are symmetric and normal bilaterally.     DIAGNOSTIC DATA (LABS, IMAGING, TESTING) - I reviewed patient records, labs, notes, testing and imaging myself where available.  Lab Results  Component Value Date   WBC 6.6 03/05/2014   HGB 14.7 03/05/2014   HCT 44 03/05/2014   MCV 85.1 10/16/2009   PLT 258 03/05/2014      Component Value Date/Time   NA 141 03/05/2014   NA 133* 10/15/2009 0615   K 4.6 03/05/2014   CL 102 10/15/2009 0615   CO2 26 10/15/2009 0615   GLUCOSE 102* 10/15/2009 0615   BUN 12 03/05/2014   BUN 9 10/15/2009 0615   CREATININE 0.8 03/05/2014   CREATININE 1.06 10/15/2009 0615   CALCIUM 7.6* 10/15/2009 0615   AST 14 08/06/2013   ALT 13 08/06/2013   ALKPHOS  97 08/06/2013   GFRNONAA >60 10/15/2009 0615   GFRAA  10/15/2009 0615    >60        The eGFR has been calculated using the MDRD equation. This calculation has not been validated in all clinical situations. eGFR's persistently <60 mL/min signify possible Chronic Kidney Disease.   Lab Results  Component Value Date   CHOL 175 08/06/2013   [place] 121 08/06/2013   TRIG 91 08/06/2013   Lab Results  Component Value Date   HGBA1C 5.4 08/06/2013   No results found for: [id number] Lab Results  Component Value Date   TSH 0.515 10/13/2009      ASSESSMENT AND PLAN 54 y.o. 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 here with:  1. Multiple sclerosis  Patient overall has remained about the same. He is completely bedridden and relies on 24/7 care. He can continue the baclofen. The left eye pain could be from the MS or a headache. Patient is nonverbal unable to give good detail. I discussed with Dr.  Jannifer Franklin and at this time we will continue to monitor the left eye pain. If the pain worsens he should let the staff at his facility know. Patient will F/U in 6 months or sooner if needed.   Ward Givens, MSN, NP-C 05/14/2014, 9:42 AM [facility] [address], [place], [place] [address] [phone]  Note: This document was prepared with digital dictation and possible smart phrase technology. Any transcriptional errors that result from this process are unintentional.

## 2014-06-12 ENCOUNTER — Non-Acute Institutional Stay (SKILLED_NURSING_FACILITY): Payer: Medicare Other | Admitting: Internal Medicine

## 2014-06-12 DIAGNOSIS — F0150 Vascular dementia without behavioral disturbance: Secondary | ICD-10-CM

## 2014-06-12 DIAGNOSIS — I69951 Hemiplegia and hemiparesis following unspecified cerebrovascular disease affecting right dominant side: Secondary | ICD-10-CM

## 2014-06-12 DIAGNOSIS — I69391 Dysphagia following cerebral infarction: Secondary | ICD-10-CM

## 2014-06-12 DIAGNOSIS — G35 Multiple sclerosis: Secondary | ICD-10-CM | POA: Diagnosis not present

## 2014-06-12 NOTE — Progress Notes (Signed)
Patient ID: Shawn Woodard, male   DOB: May 01, 1960, 54 y.o.   MRN: [id number]    Facility: [facility]  Allergies reviewed. NKDA  Code status: dnr  Chief complaint: medical management of chronic illness  HPI 54 y/o  male patient is seen for routine visit today.  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 He needs assistance with feeding. He recently came off hospice services. He remains at his baseline, can communicate his needs with some help. No recent fall or skin concerns reported. No change in behaviors or functional status reported. No concerns from staff.   ROS Difficult to obtain, but in no distress. See hpi  Past Medical History  Diagnosis Date  . MS (multiple sclerosis)   . Gait disorder   . Dementia   . Neurogenic bladder   . Vitamin D deficiency   . 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 Multiple sclerosis 04/12/2013  . Spastic quadriparesis   . Dementia    Medication reviewed. See [facility]  Physical exam BP 122/76 mmHg  Pulse 89  Temp(Src) 98 F (36.7 C)  Resp 18  General- adult male in no acute distress. Aphasia but is coherent and can comprehend Head- atraumatic, normocephalic, normal dentition Eyes- PERRLA, EOMI, no pallor, no icterus, no discharge Neck- no lymphadenopathy, no thyromegaly Throat- moist mucus membrane, normal oropharynx Cardiovascular- normal s1,s2, no murmurs Respiratory- bilateral clear to auscultation, no wheeze, no rhonchi, no crackles, no use of accessory muscles Abdomen- bowel sounds present, soft, non tender, no CVA tenderness Musculoskeletal- right hemiparesis, able to move left side, right hand flaccid, no spinal and paraspinal tenderness, no leg edema, hoyer transfer, muscle wasting, needs assistance with ADLs Skin- warm and dry Psychiatry- alert and normal mood  Labs- 08/06/13 wbc 5.2, hb 14.9, hct 45.4,  plt 298, na 136, k 4.4, cl 100, co2 32, bun 11, cr 0.8, glu 82, ca 8.6, t.chol 175, tg 91, ldl 121. hdl 36, a1c 5.4 03/04/14 wbc 6.6, hb 14.7, hct 43.9, plt 258, na 141, k 4.5, bun 12, cr 0.8  Assessment/plan  MS Reviewed neurology notes. Debility noted. Decline anticipated. No recent flare up. continue baclofen 10 mg tid for muscle spasm. 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 Continue baclofen  tid for spasticity and tylenol  every six hours as needed for pain. Continue to monitor. Assistance with ADLs, fall and pressure ulcerprophylaxis  Dysphagia as late effect of stroke Stable. Continue dysphagia diet and aspiration precautions.   Vascular dementia, without behavioral disturbance Stable with decline anticipated. Continue to monitor for change in behaviors. Monitor weight, pressure ulcer prophylaxis. Monitor for behavior changes. Continue melatonin to help regulate his sleep cycle

## 2014-07-30 ENCOUNTER — Non-Acute Institutional Stay (SKILLED_NURSING_FACILITY): Payer: Medicare Other | Admitting: Internal Medicine

## 2014-07-30 DIAGNOSIS — I69391 Dysphagia following cerebral infarction: Secondary | ICD-10-CM | POA: Diagnosis not present

## 2014-07-30 DIAGNOSIS — L98429 Non-pressure chronic ulcer of back with unspecified severity: Secondary | ICD-10-CM | POA: Insufficient documentation

## 2014-07-30 DIAGNOSIS — K5901 Slow transit constipation: Secondary | ICD-10-CM

## 2014-07-30 DIAGNOSIS — Z1211 Encounter for screening for malignant neoplasm of colon: Secondary | ICD-10-CM | POA: Diagnosis not present

## 2014-07-30 DIAGNOSIS — L89152 Pressure ulcer of sacral region, stage 2: Secondary | ICD-10-CM

## 2014-07-30 DIAGNOSIS — Z1212 Encounter for screening for malignant neoplasm of rectum: Secondary | ICD-10-CM | POA: Diagnosis not present

## 2014-07-30 DIAGNOSIS — L89159 Pressure ulcer of sacral region, unspecified stage: Secondary | ICD-10-CM | POA: Insufficient documentation

## 2014-07-30 DIAGNOSIS — E46 Unspecified protein-calorie malnutrition: Secondary | ICD-10-CM | POA: Diagnosis not present

## 2014-07-30 NOTE — Progress Notes (Signed)
Patient ID: Shawn Woodard, male   DOB: 1960/08/13, 54 y.o.   MRN: [id number]    Facility: [facility]  Allergies reviewed. NKDA  Code status: dnr  Chief complaint: medical management of chronic illness  HPI 54 y/o  male patient is seen for routine visit today. He has been at his baseline, lying in bed and watching television at present. He denies any concerns. He is being followed by SLP team and is on nectar thick liquid. He is getting skin care. 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 He needs assistance with feeding.  No recent fall reported. No change in behaviors or functional status reported.   ROS Difficult to obtain, but in no distress. See hpi. Nods head for most of the answers Denies chest pain or trouble breathing No bowel or bladder complaints  Past medical history reviewed. No changes  Current Outpatient Prescriptions on File Prior to Visit  Medication Sig Dispense Refill  . acetaminophen (TYLENOL) 325 MG tablet Take 650 mg by mouth every 6 (six) hours as needed.    . baclofen (LIORESAL) 10 MG tablet One half tablet 3 times daily for one month, then take one full tablet 3 times daily 90 each 3  . food thickener (THICK IT) POWD Take 1 Container by mouth as needed. NECTAR THICK    . Melatonin 1 MG TABS Take 1 mg by mouth at bedtime.     . Multiple Vitamin (MULTIVITAMIN) tablet Take 1 tablet by mouth daily.    . sennosides-docusate sodium (SENOKOT-S) 8.6-50 MG tablet Take 2 tablets by mouth daily.      No current facility-administered medications on file prior to visit.    Physical exam BP 133/66 mmHg  Pulse 68  Temp(Src) 98.2 F (36.8 C)  Resp 16  Ht 6' (1.829 m)  Wt 134 lb 3.2 oz (60.873 kg)  BMI 18.20 kg/m2  SpO2 97%  Wt Readings from Last 3 Encounters:  07/30/14 134 lb 3.2 oz (60.873 kg)  05/14/14 140 lb (63.504 kg)  04/09/14 140 lb 3.2 oz (63.594 kg)    General- adult male in no acute distress. Aphasia but is coherent and can comprehend. Thin built with muscle wasting Head- atraumatic, normocephalic, normal dentition Eyes- PERRLA, EOMI, no pallor, no icterus, no discharge Neck- no lymphadenopathy, no thyromegaly Throat- moist mucus membrane, normal oropharynx Cardiovascular- normal s1,s2, no murmurs Respiratory- bilateral clear to auscultation, no wheeze, no rhonchi, no crackles, no use of accessory muscles Abdomen- bowel sounds present, soft, non tender, no CVA tenderness Musculoskeletal- right hemiparesis, able to move left side, right hand flaccid, no spinal and paraspinal tenderness, no leg edema, hoyer transfer, muscle wasting, needs assistance with ADLs Skin- warm and dry Psychiatry- alert and normal mood  Labs- 08/06/13 wbc 5.2, hb 14.9, hct 45.4, plt 298, na 136, k 4.4, cl 100, co2 32, bun 11, cr 0.8, glu 82, ca 8.6, t.chol 175, tg 91, ldl 121. hdl 36, a1c 5.4 03/04/14 wbc 6.6, hb 14.7, hct 43.9, plt 258, na 141, k 4.5, bun 12, cr 0.8  Assessment/plan  Sacral pressure ulcer stage 2 Continue to clean sacrum area with normal saline, apply xerofoam and cover with bordered foam dressing. Continue pressure ulcer prophylaxis  Colorectal screening Age appropriate but given his MS and physical limitations, he is not a candidate for colonoscopy. will get FOBT card done.   Dysphagia Aspiration precautions. Continue regular diet with nectar  thick liquids. Will need assistance with feeding  Constipation Stable, continue senna s 2 tab bid  Protein calorie malnutrition Has been losing weight. Has muscle wasting. Continue medpass supplement and certavite. Monitor and assist with po intake. Monitor weight. Continue pressure ulcer prophylaxis   Oneal Grout, MD  [facility] Adult Medicine [phone] (Monday-Friday 8 am - 5 pm) [phone] (afterhours)

## 2014-08-22 ENCOUNTER — Non-Acute Institutional Stay (SKILLED_NURSING_FACILITY): Payer: Medicare Other | Admitting: Internal Medicine

## 2014-08-22 DIAGNOSIS — L8990 Pressure ulcer of unspecified site, unspecified stage: Secondary | ICD-10-CM | POA: Diagnosis not present

## 2014-08-22 DIAGNOSIS — I69951 Hemiplegia and hemiparesis following unspecified cerebrovascular disease affecting right dominant side: Secondary | ICD-10-CM | POA: Diagnosis not present

## 2014-08-22 DIAGNOSIS — G4700 Insomnia, unspecified: Secondary | ICD-10-CM

## 2014-08-22 NOTE — Progress Notes (Signed)
Patient ID: Shawn Woodard, male   DOB: 05-21-1960, 54 y.o.   MRN: [id number]   Patient ID: Shawn Woodard, male   DOB: [birth year], 54 y.o.   MRN: [id number]    Facility: [facility]  Allergies reviewed. NKDA  Code status: dnr  Chief complaint: medical management of chronic illness  HPI 54 y/o  male patient is seen for routine visit today. He has been at his baseline, lying in bed and watching television at present. His pressure ulcer in the buttock has resolved. He denies any concerns. Weight has remained stable. 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 He needs assistance with feeding.  No recent fall reported. No change in behaviors or functional status reported.   ROS Difficult to obtain, but in no distress. See hpi. Nods head for most of the answers Denies chest pain or trouble breathing No bowel or bladder complaints  Past medical history reviewed. No changes  Current Outpatient Prescriptions on File Prior to Visit  Medication Sig Dispense Refill  . acetaminophen (TYLENOL) 325 MG tablet Take 650 mg by mouth every 6 (six) hours as needed.    . baclofen (LIORESAL) 10 MG tablet One half tablet 3 times daily for one month, then take one full tablet 3 times daily 90 each 3  . food thickener (THICK IT) POWD Take 1 Container by mouth as needed. NECTAR THICK    . Melatonin 1 MG TABS Take 1 mg by mouth at bedtime.     . Multiple Vitamin (MULTIVITAMIN) tablet Take 1 tablet by mouth daily.    . sennosides-docusate sodium (SENOKOT-S) 8.6-50 MG tablet Take 2 tablets by mouth daily.      No current facility-administered medications on file prior to visit.    Physical exam BP 126/71 mmHg  Pulse 76  Temp(Src) 97.2 F (36.2 C)  Resp 18  Ht 6' (1.829 m)  Wt 133 lb 12.8 oz (60.691 kg)  BMI 18.14 kg/m2  Wt Readings from Last 3 Encounters:  08/22/14 133 lb 12.8 oz (60.691 kg)  07/30/14 134 lb  3.2 oz (60.873 kg)  05/14/14 140 lb (63.504 kg)   General- adult male in no acute distress. Aphasia but is coherent and can comprehend. Thin built with muscle wasting Head- atraumatic, normocephalic, normal dentition Eyes- PERRLA, EOMI, no pallor, no icterus, no discharge Neck- no lymphadenopathy, no thyromegaly Throat- moist mucus membrane, normal oropharynx Cardiovascular- normal s1,s2, no murmurs Respiratory- bilateral clear to auscultation, no wheeze, no rhonchi, no crackles, no use of accessory muscles Abdomen- bowel sounds present, soft, non tender, no CVA tenderness Musculoskeletal- right hemiparesis, able to move left side, right hand flaccid, no spinal and paraspinal tenderness, no leg edema, hoyer transfer, muscle wasting, needs assistance with ADLs Skin- warm and dry Psychiatry- alert and normal mood  Labs- 08/06/13 wbc 5.2, hb 14.9, hct 45.4, plt 298, na 136, k 4.4, cl 100, co2 32, bun 11, cr 0.8, glu 82, ca 8.6, t.chol 175, tg 91, ldl 121. 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 Continue baclofen 1o mg tid for now  Insomnia Stable, continue melatonin  Pressure ulcer Resolved pressure ulcer. Continue pressure ulcer prophylaxis   Oneal Grout, MD  [facility] Adult [facility] [phone] (Monday-Friday 8 am - 5 pm) [phone] (afterhours)

## 2014-09-03 LAB — LIPID PANEL
Cholesterol: 165 mg/dL (ref 0–200)
HDL: 41 mg/dL (ref 35–70)
LDL Cholesterol: 104 mg/dL
Triglycerides: 101 mg/dL (ref 40–160)

## 2014-09-03 LAB — HEPATIC FUNCTION PANEL
ALT: 17 U/L (ref 10–40)
AST: 14 U/L (ref 14–40)
Alkaline Phosphatase: 85 U/L (ref 25–125)
Bilirubin, Total: 0.3 mg/dL

## 2014-09-03 LAB — CBC AND DIFFERENTIAL
HEMATOCRIT: 43 % (ref 41–53)
HEMOGLOBIN: 15.1 g/dL (ref 13.5–17.5)
Platelets: 238 10*3/uL (ref 150–399)
WBC: 6.3 10^3/mL

## 2014-09-03 LAB — BASIC METABOLIC PANEL
BUN: 12 mg/dL (ref 4–21)
Creatinine: 0.7 mg/dL (ref 0.6–1.3)
Glucose: 82 mg/dL
Potassium: 4.1 mmol/L (ref 3.4–5.3)
SODIUM: 142 mmol/L (ref 137–147)

## 2014-09-03 LAB — TSH: TSH: 1.27 u[IU]/mL (ref 0.41–5.90)

## 2014-09-22 ENCOUNTER — Non-Acute Institutional Stay (SKILLED_NURSING_FACILITY): Payer: Medicare Other | Admitting: Internal Medicine

## 2014-09-22 DIAGNOSIS — R1310 Dysphagia, unspecified: Secondary | ICD-10-CM | POA: Diagnosis not present

## 2014-09-22 DIAGNOSIS — G35 Multiple sclerosis: Secondary | ICD-10-CM | POA: Diagnosis not present

## 2014-09-22 DIAGNOSIS — R410 Disorientation, unspecified: Secondary | ICD-10-CM

## 2014-09-22 DIAGNOSIS — I482 Chronic atrial fibrillation, unspecified: Secondary | ICD-10-CM

## 2014-09-22 DIAGNOSIS — D6859 Other primary thrombophilia: Secondary | ICD-10-CM

## 2014-09-22 DIAGNOSIS — K5901 Slow transit constipation: Secondary | ICD-10-CM

## 2014-09-22 DIAGNOSIS — F0150 Vascular dementia without behavioral disturbance: Secondary | ICD-10-CM | POA: Diagnosis not present

## 2014-09-25 DIAGNOSIS — D6859 Other primary thrombophilia: Secondary | ICD-10-CM | POA: Insufficient documentation

## 2014-09-25 DIAGNOSIS — R1310 Dysphagia, unspecified: Secondary | ICD-10-CM | POA: Insufficient documentation

## 2014-09-25 NOTE — Progress Notes (Signed)
Patient ID: Shawn Woodard, male   DOB: 1961-04-02, 54 y.o.   MRN: [id number]     Facility: [facility]  Allergies reviewed. NKDA  Code status: dnr  Chief complaint: medical management of chronic illness  HPI 54 y/o  male patient is seen for routine visit today. He has been at his baseline, lying in bed and watching television at present. 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 He needs assistance with feeding. No recent fall reported. No change in behaviors or functional status reported. He mentions that he would like the police called as he is concerned than his sister Jerline Pain has killed someone. He communicates through letter board and says the same thing several times. I have notified the administrator and social worker and have them to follow on this further. He mentions that he is safe. Denies any suicidal or homicidal thoughts.  ROS Difficult to obtain, but in no distress. See hpi. Nods head for most of the answers Denies chest pain or trouble breathing No bowel or bladder complaints No abdominal pain, no nausea or vomiting No fever or chills No dizziness, ringing of ears No hearing voices Denies depression  Past medical history reviewed. No changes  Current Outpatient Prescriptions on File Prior to Visit  Medication Sig Dispense Refill  . acetaminophen (TYLENOL) 325 MG tablet Take 650 mg by mouth every 6 (six) hours as needed.    . baclofen (LIORESAL) 10 MG tablet One half tablet 3 times daily for one month, then take one full tablet 3 times daily 90 each 3  . food thickener (THICK IT) POWD Take 1 Container by mouth as needed. NECTAR THICK    . Melatonin 1 MG TABS Take 1 mg by mouth at bedtime.     . Multiple Vitamin (MULTIVITAMIN) tablet Take 1 tablet by mouth daily.    . sennosides-docusate sodium (SENOKOT-S) 8.6-50 MG tablet Take 2 tablets by mouth daily.      No current  facility-administered medications on file prior to visit.    Physical exam BP 127/83 mmHg  Pulse 86  Temp(Src) 97.8 F (36.6 C)  Resp 18  Wt 135 lb 11.2 oz (61.553 kg)  Wt Readings from Last 3 Encounters:  09/22/14 135 lb 11.2 oz (61.553 kg)  08/22/14 133 lb 12.8 oz (60.691 kg)  07/30/14 134 lb 3.2 oz (60.873 kg)   General- adult male in no acute distress. Aphasia but is coherent and can comprehend. Thin built with muscle wasting Head- atraumatic, normocephalic, normal dentition Eyes- PERRLA, EOMI, no pallor, no icterus, no discharge Neck- no lymphadenopathy, no thyromegaly Throat- moist mucus membrane, normal oropharynx Cardiovascular- normal s1,s2, no murmurs Respiratory- bilateral clear to auscultation, no wheeze, no rhonchi, no crackles, no use of accessory muscles Abdomen- bowel sounds present, soft, non tender, no CVA tenderness Musculoskeletal- right hemiparesis, able to move left side, right hand flaccid, no spinal and paraspinal tenderness, no leg edema, hoyer transfer, muscle wasting, needs assistance with ADLs Skin- warm and dry Psychiatry- alert and oriented to place and person, normal mood  Labs- 08/06/13 wbc 5.2, hb 14.9, hct 45.4, plt 298, na 136, k 4.4, cl 100, co2 32, bun 11, cr 0.8, glu 82, ca 8.6, t.chol 175, tg 91, ldl 121. hdl 36, a1c 5.4 03/04/14 wbc 6.6, hb 14.7, hct 43.9, plt 258, na 141, k 4.5, bun 12, cr 0.8  Assessment/plan  afib Rate controlled. NSR. Not on  any rate controlling agent, monitor  Thrombophilia Stable, monitor  MS Stable, continue certagen and baclofen  Constipation stablem continue senna s 2 tab bid  confusion No new neurological focal deficit. Concern for confusion with him insisting on calling police. His dementia could contribute some. Rule out infection. Social Financial controller, Scientist, research (physical sciences) notified. Supervisor was present during today's routine visit  Vascular dementia New concern as mentioned in hpi raised  by patient. Have social work follow on this further. alert and oriented to person and place but not to time. Social worker to get in touch with family. Will get infection workup done,  Cbc with diff, cmp, u/a with c/s for now  Dysphagia Pending FEES study, aspiration precautions. On mechanical soft diet with nectar thick liquid for now and as per staff cough frequency has subsided post change in diet. Aspiration precautions   Oneal Grout, MD  [facility] Adult Medicine [phone] (Monday-Friday 8 am - 5 pm) [phone] (afterhours)

## 2014-09-26 ENCOUNTER — Telehealth: Payer: Self-pay | Admitting: Neurology

## 2014-09-26 NOTE — Telephone Encounter (Signed)
I called Latoya. Appointment made for 7/8 at 9 AM.

## 2014-09-26 NOTE — Telephone Encounter (Signed)
Patient's daughter(Latoya) called stating patient had swallow study yesterday(6/23) and he has muscle weakness in mouth and throat. He is on puree foods now. She would like to have Dr Anne Hahn re-evaluate him. No appointment available until July. Please call and advise. She can be reached at [phone].

## 2014-10-10 ENCOUNTER — Encounter: Payer: Self-pay | Admitting: Neurology

## 2014-10-10 ENCOUNTER — Ambulatory Visit (INDEPENDENT_AMBULATORY_CARE_PROVIDER_SITE_OTHER): Payer: Medicare Other | Admitting: Neurology

## 2014-10-10 VITALS — BP 133/90 | HR 83 | Ht 74.0 in

## 2014-10-10 DIAGNOSIS — G35 Multiple sclerosis: Secondary | ICD-10-CM

## 2014-10-10 DIAGNOSIS — F09 Unspecified mental disorder due to known physiological condition: Secondary | ICD-10-CM

## 2014-10-10 HISTORY — DX: Unspecified mental disorder due to known physiological condition: F09

## 2014-10-10 NOTE — Progress Notes (Signed)
Reason for visit: Multiple sclerosis  Shawn Woodard is an 54 y.o. male  History of present illness:  Shawn Woodard is a 54 year old right-handed black male with a history of chronic progressive multiple sclerosis. The patient continues to decline in his overall functional level. 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 The patient has some function of the left arm, but very little voluntary function of the right arm or either leg. The patient is nonambulatory. The sister is the power of attorney, she is present at this office visit. She denies any knowledge of any skin breakdown issues. The patient has had problems with swallowing, a recent FEES study revealed dysphagia issues, the patient was converted to a pured type diet with nectar thick liquids. The patient has indicated that he does not wish to have a feeding tube if this becomes necessary. He denies any ongoing headache or eye pain, he has no pain elsewhere in the body. He returns for an evaluation.  Past Medical History  Diagnosis Date  . MS (multiple sclerosis)   . Gait disorder   . Dementia   . Neurogenic bladder   . Vitamin D deficiency   . 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 Multiple sclerosis 04/12/2013  . Spastic quadriparesis   . Dementia   . 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 None      Family History  Problem Relation Age of Onset  . Stroke Father     Social history:  reports that he has never smoked. He does not have any smokeless tobacco history on file. He reports that he does not drink alcohol or use illicit drugs.   No Known Allergies  Medications:  Prior to Admission medications   Medication Sig Start Date End Date Taking? Authorizing Provider  baclofen (LIORESAL) 10 MG tablet One half tablet 3 times daily for one month, then take one full  tablet 3 times daily 07/23/13  Yes York Spaniel, MD  Melatonin 1 MG TABS Take 1 mg by mouth at bedtime.    Yes Historical Provider, MD  Multiple Vitamin (MULTIVITAMIN) tablet Take 1 tablet by mouth daily.   Yes Historical Provider, MD  sennosides-docusate sodium (SENOKOT-S) 8.6-50 MG tablet Take 2 tablets by mouth daily.    Yes Historical Provider, MD    ROS:  Out of a complete 14 system review of symptoms, the patient complains only of the following symptoms, and all other reviewed systems are negative.  Light sensitivity, eye pain Dysphagia Restless legs Joint pain Memory loss, speech difficulty, weakness  Blood pressure 133/90, pulse 83, height 6\' 2"  (1.88 m).  Physical Exam  General: The patient is alert and cooperative at the time of the examination.  Skin: No significant peripheral edema is noted.   Neurologic Exam  Mental status: The patient is alert and cooperative, the patient is able to follow verbal commands.   Cranial nerves: Facial symmetry is present. Speech is severely dysarthric, the patient will drool. The patient has coarse nystagmus in all fields of gaze, incomplete conjugate deviation of the eyes with right and left gaze. Pupils are equal, round, and reactive to light. Discs are flat bilaterally.  Motor: The patient has increased motor tone on all 4 extremities. The patient has a flexion contracture at the right elbow, the patient is able to voluntarily use the  left arm with 4/5 strength throughout. The patient has significant weakness of the right arm, unable to voluntarily use either lower extremity, increased tone noted on both lower extremities.  Sensory examination: Soft touch sensation is symmetric on the face, arms, and legs.  Coordination: The patient is unable to effectively perform finger-nose-finger or heel-to-shin on either side.  Gait and station: The patient is not ambulatory.  Reflexes: Deep tendon reflexes are symmetric. Episodic  spontaneous Babinski movements are seen with the left leg.   Assessment/Plan:  1. Multiple sclerosis, end-stage  2. Organic brain syndrome  3. Gait disorder  The patient continues to progress significantly with his multiple sclerosis. The patient is bedridden, he requires assistance with all activities of daily living including feeding. He is not a candidate for disease modifying agents for multiple sclerosis. The patient will require supportive care. He does not wish to have a feeding tube when he becomes unable to swallow. He will follow-up in about 9 months, sooner if needed.  Shawn Palau MD 10/10/2014 5:34 PM  [facility] [address] [place], [place] [address]  Phone [phone] Fax [phone]

## 2014-10-10 NOTE — Patient Instructions (Signed)

## 2014-10-30 ENCOUNTER — Encounter: Payer: Self-pay | Admitting: Internal Medicine

## 2014-10-30 ENCOUNTER — Non-Acute Institutional Stay (SKILLED_NURSING_FACILITY): Payer: Medicare Other | Admitting: Internal Medicine

## 2014-10-30 DIAGNOSIS — G35 Multiple sclerosis: Secondary | ICD-10-CM

## 2014-10-30 DIAGNOSIS — R1312 Dysphagia, oropharyngeal phase: Secondary | ICD-10-CM | POA: Diagnosis not present

## 2014-10-30 DIAGNOSIS — E46 Unspecified protein-calorie malnutrition: Secondary | ICD-10-CM | POA: Diagnosis not present

## 2014-10-30 DIAGNOSIS — E785 Hyperlipidemia, unspecified: Secondary | ICD-10-CM | POA: Diagnosis not present

## 2014-10-30 DIAGNOSIS — I482 Chronic atrial fibrillation, unspecified: Secondary | ICD-10-CM

## 2014-10-30 NOTE — Progress Notes (Signed)
Patient ID: Shawn Woodard, male   DOB: 07-Jan-1961, 54 y.o.   MRN: [id number]      Facility: [facility]  Allergies reviewed. NKDA  Code status: DNR  Chief complaint: medical management of chronic illness  HPI 54 y/o  male patient is seen for routine visit today. He denies any concerns. No new concern from staff. His skin tear has resolved. He is now on pureed diet with dysphagia. His weight is overall stable. He has been at his baseline, lying in bed and watching television at present. 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   Review of Systems  Constitutional: Negative for fever, chills, malaise/fatigue and diaphoresis.  HENT: Negative for congestion, hearing loss and sore throat.   Eyes: Negative for blurred vision, double vision and discharge.  Respiratory: Negative for cough, sputum production, shortness of breath and wheezing.   Cardiovascular: Negative for chest pain, palpitations, orthopnea and leg swelling.  Gastrointestinal: Negative for heartburn, nausea, vomiting, abdominal pain Genitourinary: Negative for dysuria Musculoskeletal: Negative for back pain, falls Skin: Negative for itching and rash.  Neurological: Negative for dizziness Psychiatric/Behavioral: Negative for depression   Past medical history reviewed. No changes    Medication List       This list is accurate as of: 10/30/14  3:04 PM.  Always use your most recent med list.               baclofen 10 MG tablet  Commonly known as:  LIORESAL  One tablet 3 times daily     Melatonin 1 MG Tabs  Take 1 mg by mouth at bedtime.     multivitamin tablet  Take 1 tablet by mouth daily.     sennosides-docusate sodium 8.6-50 MG tablet  Commonly known as:  SENOKOT-S  Take 2 tablets by mouth 2 (two) times daily.       Physical exam BP 117/84 mmHg  Pulse 59  Ht 6' (1.829 m)  Wt 134 lb 12.8 oz (61.145 kg)  BMI  18.28 kg/m2  SpO2 99%  Wt Readings from Last 3 Encounters:  10/30/14 134 lb 12.8 oz (61.145 kg)  09/22/14 135 lb 11.2 oz (61.553 kg)  08/22/14 133 lb 12.8 oz (60.691 kg)   General- adult male in no acute distress. Aphasia but is coherent and can comprehend. Thin built with muscle wasting Head- atraumatic, normocephalic, normal dentition Eyes- PERRLA, EOMI, no pallor, no icterus, no discharge Neck- no lymphadenopathy, no thyromegaly Throat- moist mucus membrane, normal oropharynx Cardiovascular- normal s1,s2, no murmurs Respiratory- bilateral clear to auscultation, no wheeze, no rhonchi, no crackles, no use of accessory muscles Abdomen- bowel sounds present, soft, non tender, no CVA tenderness Musculoskeletal- right hemiparesis, able to move left side, right hand flaccid, no spinal and paraspinal tenderness, no leg edema, hoyer transfer, muscle wasting, needs assistance with ADLs Skin- warm and dry Psychiatry- alert and oriented to place and person, normal mood  Labs- 08/06/13 wbc 5.2, hb 14.9, hct 45.4, plt 298, na 136, k 4.4, cl 100, co2 32, bun 11, cr 0.8, glu 82, ca 8.6, t.chol 175, tg 91, ldl 121. hdl 36, a1c 5.4 03/04/14 wbc 6.6, hb 14.7, hct 43.9, plt 258, na 141, k 4.5, bun 12, cr 0.8 09/03/14 wbc 6.3, hb 15.1, hct 43.3, plt 238, na 142,k 4.1, bun 12, cr 0.73, glu 82, t.chol 165, tg 101, hdl 41, ldl 104, tsh 1.267  Assessment/plan  Dysphagia  oropharyngeal phase, continue pureed diet with aspiration precautions  Protein calorie malnutrition Low but stable weight, monitor po intake, decline anticipated with dementia. Continue assistance with feed and magic cup supplement  HLD ldl 104, given his young age and hx of CVA and MS, start low dose statin lipitor 10 mg daily  afib Rate controlled. NSR. Not on any rate controlling agent. Started on statin from today. Will have him on baby aspirin as well with CHADS of 2.  MS Stable, continue certagen and baclofen   Oneal Grout,  MD  [facility] Adult Medicine [phone] (Monday-Friday 8 am - 5 pm) [phone] (afterhours)

## 2014-12-03 ENCOUNTER — Non-Acute Institutional Stay (SKILLED_NURSING_FACILITY): Payer: Medicare Other | Admitting: Internal Medicine

## 2014-12-03 ENCOUNTER — Encounter: Payer: Self-pay | Admitting: Internal Medicine

## 2014-12-03 DIAGNOSIS — L0231 Cutaneous abscess of buttock: Secondary | ICD-10-CM

## 2014-12-03 DIAGNOSIS — K5900 Constipation, unspecified: Secondary | ICD-10-CM | POA: Diagnosis not present

## 2014-12-03 DIAGNOSIS — E785 Hyperlipidemia, unspecified: Secondary | ICD-10-CM

## 2014-12-03 NOTE — Progress Notes (Signed)
Patient ID: Shawn Woodard, male   DOB: [birth year], 54 y.o.   MRN: [id number]      Facility: [facility]  Allergies reviewed. NKDA  Code status: DNR  Chief complaint: medical management of chronic illness  HPI 54 y/o  male patient is seen for routine visit today. He denies any concerns. He is currently being treated with doxycylcine for abscess in his buttocks. No other new concern from staff. He has been at his baseline, lying in bed and watching television at present. 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   Review of Systems  Constitutional: Negative for fever, chills, malaise/fatigue and diaphoresis.  HENT: Negative for congestion, hearing loss and sore throat.   Eyes: Negative for blurred vision, double vision and discharge.  Respiratory: Negative for cough, sputum production, shortness of breath and wheezing.   Cardiovascular: Negative for chest pain, palpitations, orthopnea and leg swelling.  Gastrointestinal: Negative for heartburn, nausea, vomiting, abdominal pain Genitourinary: Negative for dysuria Musculoskeletal: Negative for back pain, falls Skin: Negative for itching and rash.  Neurological: Negative for dizziness Psychiatric/Behavioral: Negative for depression   Past medical history reviewed. No changes    Medication List       This list is accurate as of: 12/03/14 11:46 AM.  Always use your most recent med list.               aspirin EC 81 MG tablet  Take 81 mg by mouth daily.     atorvastatin 10 MG tablet  Commonly known as:  LIPITOR  Take 10 mg by mouth daily.     baclofen 10 MG tablet  Commonly known as:  LIORESAL  One tablet 3 times daily     doxycycline 100 MG tablet  Commonly known as:  VIBRA-TABS  Take 100 mg by mouth 2 (two) times daily. X 10 days starting 11/26/14, ending 12/06/14     Melatonin 1 MG Tabs  Take 1 mg by mouth at bedtime.     multivitamin tablet  Take 1 tablet by mouth daily.     sennosides-docusate sodium 8.6-50 MG tablet  Commonly known as:  SENOKOT-S  Take 2 tablets by mouth 2 (two) times daily.       Physical exam BP 115/79 mmHg  Pulse 72  Temp(Src) 98.1 F (36.7 C) (Oral)  Resp 20  Ht 6' (1.829 m)  Wt 135 lb 12.8 oz (61.598 kg)  BMI 18.41 kg/m2  SpO2 95%  Wt Readings from Last 3 Encounters:  12/03/14 135 lb 12.8 oz (61.598 kg)  10/30/14 134 lb 12.8 oz (61.145 kg)  09/22/14 135 lb 11.2 oz (61.553 kg)   General- adult male in no acute distress. Aphasia but is coherent and can comprehend. Thin built with muscle wasting Head- atraumatic, normocephalic, normal dentition Eyes- PERRLA, EOMI, no pallor, no icterus, no discharge Neck- no lymphadenopathy, no thyromegaly Throat- moist mucus membrane, normal oropharynx Cardiovascular- normal s1,s2, no murmurs Respiratory- bilateral clear to auscultation, no wheeze, no rhonchi, no crackles, no use of accessory muscles Abdomen- bowel sounds present, soft, non tender, no CVA tenderness Musculoskeletal- right hemiparesis, able to move left side, right hand flaccid, no spinal and paraspinal tenderness, no leg edema, hoyer transfer, muscle wasting, needs assistance with ADLs Psychiatry- alert and oriented to place and person, normal mood  Labs- 08/06/13 wbc 5.2, hb 14.9, hct 45.4, plt 298, na 136, k 4.4, cl 100, co2 32,  bun 11, cr 0.8, glu 82, ca 8.6, t.chol 175, tg 91, ldl 121. hdl 36, a1c 5.4 03/04/14 wbc 6.6, hb 14.7, hct 43.9, plt 258, na 141, k 4.5, bun 12, cr 0.8 09/03/14 wbc 6.3, hb 15.1, hct 43.3, plt 238, na 142,k 4.1, bun 12, cr 0.73, glu 82, t.chol 165, tg 101, hdl 41, ldl 104, tsh 1.267  Assessment/plan  Buttock abscess Remains afebrile. Continue and complete 10 days course of doxycycline 100 mg bid. Monitor clinically. continue bacitracin ointment  HLD continue lipitor 10 mg daily for now and monitor  Constipation Stable, continue senokot  s  Oneal Grout, MD  [facility] [phone] (Monday-Friday 8 am - 5 pm) [phone] (afterhours)

## 2014-12-29 ENCOUNTER — Non-Acute Institutional Stay (SKILLED_NURSING_FACILITY): Payer: Medicare Other | Admitting: Internal Medicine

## 2014-12-29 DIAGNOSIS — I69391 Dysphagia following cerebral infarction: Secondary | ICD-10-CM | POA: Diagnosis not present

## 2014-12-29 DIAGNOSIS — I69951 Hemiplegia and hemiparesis following unspecified cerebrovascular disease affecting right dominant side: Secondary | ICD-10-CM

## 2014-12-29 DIAGNOSIS — E46 Unspecified protein-calorie malnutrition: Secondary | ICD-10-CM | POA: Diagnosis not present

## 2014-12-29 NOTE — Progress Notes (Signed)
Patient ID: Shawn Woodard, male   DOB: 12/04/1960, 54 y.o.   MRN: [id number]      [facility]     [facility] of Service[facility] (31)    PCP: Oneal Grout, MD   Code Status: DNR   No Known Allergies  Chief Complaint  Patient presents with  . Medical Management of Chronic Issues     HPI:  54 y.o. patient is seen for routine visit. He is being fed by the CNA. He denies any concerns. His abscess has resolved and he has completed the antibiotic course. No new concern from staff. 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 No recent flare up of his MS. No falls reported. He has been out of bed to the geri chair daily. Sleeping well at night. Muscle spasticity controlled with baclofen.  Review of Systems  Constitutional: Negative for fever, chills, malaise/fatigue and diaphoresis.  HENT: Negative for congestion, hearing loss and sore throat.   Eyes: Negative for blurred vision, double vision and discharge.  Respiratory: Negative for cough, sputum production, shortness of breath and wheezing.   Cardiovascular: Negative for chest pain, palpitations, orthopnea and leg swelling.  Gastrointestinal: Negative for heartburn, nausea, vomiting, abdominal pain Genitourinary: Negative for dysuria Musculoskeletal: Negative for back pain, falls Skin: Negative for itching and rash.  Neurological: Negative for dizziness Psychiatric/Behavioral: Negative for depression    Past Medical History  Diagnosis Date  . MS (multiple sclerosis)   . Gait disorder   . Dementia   . Neurogenic bladder   . Vitamin D deficiency   . 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 Multiple sclerosis 04/12/2013  . Spastic quadriparesis   . Dementia   . 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 None      Medications:   Medication List        This list is accurate as of: 12/29/14  2:32 PM.  Always use your most recent med list.               aspirin EC 81 MG tablet  Take 81 mg by mouth daily.     atorvastatin 10 MG tablet  Commonly known as:  LIPITOR  Take 10 mg by mouth daily.     baclofen 10 MG tablet  Commonly known as:  LIORESAL  One tablet 3 times daily     Melatonin 1 MG Tabs  Take 1 mg by mouth at bedtime.     multivitamin tablet  Take 1 tablet by mouth daily.     sennosides-docusate sodium 8.6-50 MG tablet  Commonly known as:  SENOKOT-S  Take 2 tablets by mouth 2 (two) times daily.        Immunizations: Immunization History  Administered Date(s) Administered  . Influenza-Unspecified 01/14/2014  . PPD Test 08/08/2013, 08/16/2013, 12/24/2013  . Pneumococcal Polysaccharide-23 02/03/2014    Health maintenance: Health Maintenance  Topic Date Due  . Hepatitis C Screening  Aug 08, 1960  . INFLUENZA VACCINE  11/03/2014  . COLONOSCOPY  07/30/2015 (Originally 09/14/2010)  . TETANUS/TDAP  07/30/2015 (Originally 09/14/1979)  . HIV Screening  07/30/2015 (Originally 09/14/1975)   Fall Risk  12/29/2014  Falls in the past year? No  Risk for fall due to : Impaired balance/gait   Depression screen [facility] 2/9 12/29/2014  Decreased Interest 0  Down, Depressed, Hopeless 0  PHQ - 2 Score 0    Physical Exam:  BP 150/84 mmHg  Pulse 88  Temp(Src) 97.3 F (36.3 C)  Resp 18  Wt 137 lb 9.6 oz (62.415 kg)  SpO2 98%  Filed Vitals:   12/29/14 1404  Weight: 137 lb 9.6 oz (62.415 kg)   Body mass index is 18.66 kg/(m^2).  General- adult male in no acute distress. Aphasic but is coherent and can comprehend. Thin built with muscle wasting Head- atraumatic, normocephalic, normal dentition Eyes- PERRLA, EOMI, no pallor, no icterus, no discharge Neck- no lymphadenopathy, no thyromegaly Throat- moist mucus membrane, normal oropharynx Cardiovascular- normal s1,s2, no murmurs Respiratory- bilateral clear to  auscultation, no wheeze, no rhonchi, no crackles, no use of accessory muscles Abdomen- bowel sounds present, soft, non tender, no CVA tenderness Musculoskeletal- right hemiparesis, able to move left side, right hand flaccid, no spinal and paraspinal tenderness, no leg edema, hoyer transfer, muscle wasting, needs assistance with ADLs Psychiatry- alert and oriented to place and person, normal mood  Labs- 08/06/13 wbc 5.2, hb 14.9, hct 45.4, plt 298, na 136, k 4.4, cl 100, co2 32, bun 11, cr 0.8, glu 82, ca 8.6, t.chol 175, tg 91, ldl 121. hdl 36, a1c 5.4 03/04/14 wbc 6.6, hb 14.7, hct 43.9, plt 258, na 141, k 4.5, bun 12, cr 0.8 09/03/14 wbc 6.3, hb 15.1, hct 43.3, plt 238, na 142,k 4.1, bun 12, cr 0.73, glu 82, t.chol 165, tg 101, hdl 41, ldl 104, tsh 1.267  Assessment/plan  Protein calorie malnutrition Low weight with BMI of 18.6. Encourage po intake, assistance with feeding, pressure ulcer prophylaxis. Continue multivitamin  Dysphagia Continue pureed diet with honey thick liquid, aspiration precautions  Old cva with right hemiplegia Continue aspirin and atorvastatin. Few elevated bp readings but overall reading stable, monitor bp reading.   Oneal Grout, MD  [facility] [phone] [place], [place] [address] Monday-Friday 8 am - 5 pm: [phone] Afterhours: [phone]  Fax: [phone]

## 2015-03-09 ENCOUNTER — Telehealth: Payer: Self-pay | Admitting: Neurology

## 2015-03-09 NOTE — Telephone Encounter (Signed)
I called the patient's daughter. She states that she went to visit him Tuesday and noticed that he could not swallow and he didn't look good. She states he has since been diagnosed with a UTI and the nursing facility is having to hydrate him via IV. She states the patient cannot come in for an appointment but she would like to speak to Dr. Anne Hahn about these changes.

## 2015-03-09 NOTE — Telephone Encounter (Signed)
Pt's daughter returned Kelby's call

## 2015-03-09 NOTE — Telephone Encounter (Signed)
I called the daughter. The patient has had a sudden change in his swallowing, he has a bladder infection, likely has a pseudo-exacerbation of the multiple sclerosis. He is unable to swallow, but he is getting IV fluids and antibiotics to treat urinary tract infection. Hopefully, he will recover his ability to swallow once the infection has cleared.

## 2015-03-09 NOTE — Telephone Encounter (Signed)
I returned the patient's daughter's call. Left voicemail asking her to call me back.

## 2015-03-09 NOTE — Telephone Encounter (Signed)
Patient's daughter is calling and states her father who is at [facility] can't swallow and has a UTI.  Hospice has been called in.  Please call.

## 2015-03-19 ENCOUNTER — Telehealth: Payer: Self-pay | Admitting: Neurology

## 2015-03-19 NOTE — Telephone Encounter (Signed)
Daughter also wanted to say a special thank you to Dr. Anne Hahn for giving her father great care all these years.

## 2015-03-19 NOTE — Telephone Encounter (Signed)
Daughter Shawn Woodard called to advise patient deceased last 09-01-22 04-09-2015 around 8:30pm

## 2015-04-05 DEATH — deceased

## 2015-07-01 NOTE — Telephone Encounter (Signed)
Error

## 2015-07-14 ENCOUNTER — Ambulatory Visit: Payer: Medicare Other | Admitting: Adult Health
